# Patient Record
Sex: Male | Born: 1980 | Race: Black or African American | Hispanic: No | Marital: Single | Smoking: Current every day smoker
Health system: Southern US, Community
[De-identification: ages and names within clinical notes are randomized; demographics above are authoritative.]

## PROBLEM LIST (undated history)

## (undated) DIAGNOSIS — G43909 Migraine, unspecified, not intractable, without status migrainosus: Secondary | ICD-10-CM

## (undated) DIAGNOSIS — IMO0002 Reserved for concepts with insufficient information to code with codable children: Secondary | ICD-10-CM

## (undated) DIAGNOSIS — F99 Mental disorder, not otherwise specified: Secondary | ICD-10-CM

---
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
## 2003-03-27 ENCOUNTER — Emergency Department (HOSPITAL_COMMUNITY): Admission: EM | Admit: 2003-03-27 | Discharge: 2003-03-27 | Payer: Self-pay | Admitting: Emergency Medicine

## 2006-04-28 ENCOUNTER — Emergency Department (HOSPITAL_COMMUNITY): Admission: EM | Admit: 2006-04-28 | Discharge: 2006-04-28 | Payer: Self-pay | Admitting: Emergency Medicine

## 2007-12-20 ENCOUNTER — Emergency Department (HOSPITAL_COMMUNITY): Admission: EM | Admit: 2007-12-20 | Discharge: 2007-12-20 | Payer: Self-pay | Admitting: Emergency Medicine

## 2008-05-20 ENCOUNTER — Emergency Department (HOSPITAL_COMMUNITY): Admission: EM | Admit: 2008-05-20 | Discharge: 2008-05-21 | Payer: Self-pay | Admitting: Emergency Medicine

## 2009-11-06 ENCOUNTER — Emergency Department (HOSPITAL_COMMUNITY): Admission: EM | Admit: 2009-11-06 | Discharge: 2009-11-06 | Payer: Self-pay | Admitting: Emergency Medicine

## 2010-05-21 ENCOUNTER — Emergency Department (HOSPITAL_COMMUNITY): Payer: Self-pay

## 2010-05-21 ENCOUNTER — Inpatient Hospital Stay (HOSPITAL_COMMUNITY)
Admission: EM | Admit: 2010-05-21 | Discharge: 2010-05-23 | DRG: 378 | Disposition: A | Discharge status: Home / Self Care | Payer: Self-pay | Attending: Internal Medicine | Admitting: Internal Medicine

## 2010-05-21 DIAGNOSIS — F1910 Other psychoactive substance abuse, uncomplicated: Secondary | ICD-10-CM | POA: Diagnosis present

## 2010-05-21 DIAGNOSIS — Z23 Encounter for immunization: Secondary | ICD-10-CM

## 2010-05-21 DIAGNOSIS — K254 Chronic or unspecified gastric ulcer with hemorrhage: Principal | ICD-10-CM | POA: Diagnosis present

## 2010-05-21 DIAGNOSIS — G43909 Migraine, unspecified, not intractable, without status migrainosus: Secondary | ICD-10-CM | POA: Diagnosis present

## 2010-05-21 DIAGNOSIS — F17200 Nicotine dependence, unspecified, uncomplicated: Secondary | ICD-10-CM | POA: Diagnosis present

## 2010-05-21 DIAGNOSIS — T39095A Adverse effect of salicylates, initial encounter: Secondary | ICD-10-CM | POA: Diagnosis present

## 2010-05-21 DIAGNOSIS — D62 Acute posthemorrhagic anemia: Secondary | ICD-10-CM | POA: Diagnosis present

## 2010-05-21 LAB — OCCULT BLOOD, POC DEVICE: Fecal Occult Bld: POSITIVE

## 2010-05-21 LAB — COMPREHENSIVE METABOLIC PANEL
ALT: 12 U/L (ref 0–53)
Alkaline Phosphatase: 53 U/L (ref 39–117)
BUN: 39 mg/dL — ABNORMAL HIGH (ref 6–23)
CO2: 27 mEq/L (ref 19–32)
GFR calc non Af Amer: >60 mL/min (ref >60)
Glucose, Bld: 97 mg/dL (ref 70–99)
Potassium: 4.2 mEq/L (ref 3.5–5.1)
Sodium: 139 mEq/L (ref 135–145)
Total Bilirubin: 0.3 mg/dL (ref 0.3–1.2)

## 2010-05-21 LAB — CBC
HCT: 26.3 % — ABNORMAL LOW (ref 39.0–52.0)
Hemoglobin: 8.6 g/dL — ABNORMAL LOW (ref 13.0–17.0)
MCH: 29.2 pg (ref 26.0–34.0)
MCHC: 32.7 g/dL (ref 30.0–36.0)
MCV: 89.2 fL (ref 78.0–100.0)
Platelets: 276 10*3/uL (ref 150–400)
RBC: 2.95 MIL/uL — ABNORMAL LOW (ref 4.22–5.81)
RDW: 13.3 % (ref 11.5–15.5)
WBC: 11.1 10*3/uL — ABNORMAL HIGH (ref 4.0–10.5)

## 2010-05-22 ENCOUNTER — Other Ambulatory Visit: Payer: Self-pay | Admitting: Gastroenterology

## 2010-05-22 LAB — IRON AND TIBC
Saturation Ratios: 36 % (ref 20–55)
TIBC: 286 ug/dL (ref 215–435)

## 2010-05-22 LAB — CBC
HCT: 21.5 % — ABNORMAL LOW (ref 39.0–52.0)
HCT: 20.5 % — ABNORMAL LOW (ref 39.0–52.0)
HCT: 23.7 % — ABNORMAL LOW (ref 39.0–52.0)
Hemoglobin: 6.6 g/dL — CL (ref 13.0–17.0)
MCH: 28.4 pg (ref 26.0–34.0)
MCH: 29.6 pg (ref 26.0–34.0)
MCHC: 32.2 g/dL (ref 30.0–36.0)
MCHC: 33.3 g/dL (ref 30.0–36.0)
MCV: 88.8 fL (ref 78.0–100.0)
RDW: 13.3 % (ref 11.5–15.5)
RDW: 13.6 % (ref 11.5–15.5)
WBC: 7.1 10*3/uL (ref 4.0–10.5)

## 2010-05-22 LAB — FOLATE: Folate: 11.3 ng/mL

## 2010-05-22 LAB — ABO/RH: ABO/RH(D): O POS

## 2010-05-22 LAB — MRSA PCR SCREENING: MRSA by PCR: NEGATIVE

## 2010-05-22 LAB — RETICULOCYTES: Retic Ct Pct: 1.6 % (ref 0.4–3.1)

## 2010-05-23 LAB — CBC
MCH: 29.6 pg (ref 26.0–34.0)
MCHC: 33.6 g/dL (ref 30.0–36.0)
Platelets: 199 10*3/uL (ref 150–400)
Platelets: 205 10*3/uL (ref 150–400)
RDW: 13.8 % (ref 11.5–15.5)
WBC: 7.3 10*3/uL (ref 4.0–10.5)

## 2010-05-23 LAB — CROSSMATCH
ABO/RH(D): O POS
Antibody Screen: NEGATIVE
Unit division: 0

## 2010-05-23 NOTE — H&P (Signed)
[name], [name]                   ACCOUNT NO.:  [id number]  MEDICAL RECORD NO.:  [id number]           PATIENT TYPE:  E  LOCATION:  [facility]                         FACILITY:  [facility]  PHYSICIAN:  Conley Canal, MD      DATE OF BIRTH:  26-Mar-1980  DATE OF ADMISSION:  05/21/2010 DATE OF DISCHARGE:                             HISTORY & PHYSICAL   PRIMARY CARE PHYSICIAN:  None.  CHIEF COMPLAINT:  Melena, abdominal pain.  HISTORY OF PRESENT ILLNESS:  This is a 30 year old male with history of polysubstance abuse, tobacco habituation, migraine headaches who came into the emergency room with complaints of abdominal pain and melena, which started during the day.  The patient has been take taking a lot of Excedrin Headache for migraine headaches.  He has also been snorting cocaine.  He also complained of not feeling well.  At the time of evaluation, the patient is not very eager to give history, but the emergency room record, he vomited some blood clots and he has had melena.  His hemoglobin was 8.6, hematocrit 26.3, no baseline to compare with.  He was also hypotensive and tachycardic with blood pressure in the 70s systolic, heart rate around 117.  He has been given some crystalloids with appropriate response in the blood pressure. Gastroenterology, Dr. Bosie Clos was consulted by the emergency room.  The patient denies history of peptic ulcer disease.  PAST MEDICAL HISTORY: 1. Migraine headaches. 2. Polysubstance abuse.  HOME MEDICATIONS:  Excedrin Headache.  SOCIAL HISTORY:  The patient smokes cigarettes.  Denies alcohol.  Snorts cocaine.  FAMILY HISTORY:  Denies history of chronic medical conditions.  ALLERGIES:  No known drug allergies.  REVIEW OF SYSTEMS:  Unremarkable except as highlighted in the history of present illness.  PHYSICAL EXAMINATION:  GENERAL:  This is a young male who seems to be in discomfort, is mostly somnolent. VITALS:  Blood pressure 116/72, heart rate 90s,  he is febrile, oxygenating adequately, respiratory rate 16. HEAD, EARS, NOSE, AND THROAT:  Pupils equal and reacting to light. NECK:  No jugular venous distention.  No carotid bruits. RESPIRATORY:  Good air entry bilaterally with no rhonchi, rales, or wheezes. CARDIOVASCULAR:  First and second heart sounds heard.  No murmurs. Pulse regular. ABDOMEN:  Scaphoid, soft, nontender.  No palpable organomegaly.  Bowel sounds are normal. CNS:  The patient alert, responding to questions appropriately, no focal deficits. EXTREMITIES:  No pedal edema.  Peripheral pulses equal.  LABORATORY DATA:  Labs reviewed significant for WBC 7.1, hemoglobin 8.6, hematocrit 26.3, platelet count 276.  Sodium 139, potassium 4.2, BUN 39, creatinine 1.13.  Urine drug screen positive for cocaine, opiates, marijuana.  Abdominal x-ray showed nonobstructive bowel gas pattern.  No active cardiopulmonary disease.  IMPRESSION:  A 30 year old male presenting with acute GI bleeding most likely bleeding peptic ulcer disease.  He is also anemic, baseline hemoglobin not clear.  His BUN elevated probably suggest GI blood loss.  PLAN: 1. Upper GI bleeding with acute blood loss anemia.  We will admit the     patient to step-down unit, monitor hemoglobin/hematocrit serially,  transfuse PRBC for target hematocrit less than at least 25.  We     will place the patient on PPI.  Avoid nonsteroidal anti-     inflammatory drugs.  Follow up with gastroenterologist.  The     patient will most likely require EGD.  We will keep the patient     n.p.o. 2. Polysubstance abuse.  The patient currently drowsy wanting to     follow this up, the patient want to be more awake. 3. Tobacco habituation.  Smoking cessation counseling given, placed on     nicotine patch. 4. DVT prophylaxis.  SCDs. 5. The patient's condition is closely guarded.     Conley Canal, MD     SR/MEDQ  D:  05/22/2010  T:  05/22/2010  Job:   [id number]  Electronically Signed by Conley Canal  on 05/23/2010 05:23:56 PM

## 2010-05-24 NOTE — Discharge Summary (Signed)
NAMECISCO, KINDT                   ACCOUNT NO.:  [id number]  MEDICAL RECORD NO.:  [id number]           PATIENT TYPE:  I  LOCATION:  5511                         FACILITY:  [facility]  PHYSICIAN:  Marinda Elk, M.D.DATE OF BIRTH:  Jul 05, 1980  DATE OF ADMISSION:  05/21/2010 DATE OF DISCHARGE:  05/23/2010                              DISCHARGE SUMMARY   PRIMARY CARE DOCTOR:  None.  DISCHARGE DIAGNOSES: 1. Acute loss anemia secondary to peptic ulcer disease, status post     EGD. Biopsy pending for H. Pylori. 2. Tobacco abuse. 3. Polysubstance abuse.  DISCHARGE MEDICATIONS: 1. Tylenol 650 mg q.4 h. p.r.n. 2. Nicotine patch 14 mg q.24 h transdermally daily. 3. Protonix 40 mg 1 tablet b.i.d. 4. Sumatriptan 50 mg he as directed.  PROCEDURES PERFORMED:  EGD that showed antral ulcer with significant stigma of previous hemorrhage.  Acute abdominal series showed no obstructive bowel gas pattern.  No cardiopulmonary disease.  BRIEF ADMITTING H AND P:  This 30 year old male with past medical history of polysubstance abuse, tobacco, and migraine headaches, who presents to the emergency room complaining of abdominal pain and melena. Please refer to dictation.  This melena which started during the day. The patient has been taking a lot of Excedrin for the migraine headaches.  He is also being snorting lot of cocaine.  He has now been feeling well.  Please refer to dictation May 06, 2010, for further details.  PHYSICAL EXAM:  VITAL SIGNS:  Blood pressure 116/72, pulse of 90, afebrile, oxygenating adequately, breathing 16 times per minutes. HEENT: Pupils equally round, reactive to light. NECK:  No JVD, no bruits. LUNGS:  Good air movement.  Clear to auscultation. CARDIOVASCULAR:  Positive S1-S2.  No murmurs, rubs or gallops.  Regular rate and rhythm. ABDOMEN:  Positive bowel sounds, soft, nontender, organomegaly.  Bowel sounds present. CNS: Alert, not responding to question,  "appropriately nonfocal."  LABS ON ADMISSION:  White count of 7.1, hemoglobin of 8.6, platelet count 276, sodium 31, potassium 4.2, BUN of 39, creatinine 1.9.  UDS is positive for opiates, cocaine and marijuana.  Abdominal x-ray as above.  ASSESSMENT/PLAN: 1. Acute blood loss anemia secondary to peptic ulcer disease, status     post EGD and 2 units transfused.  Dr. Madilyn Fireman did the endoscopy, he     put him on high-dose PPI.  H. pylori biopsy is pending at the time     of this dictation.  He needs to follow up with GI for results of     the H. pylori biopsy.  It was discussed extensively with the     patient that he needs to avoid nonsteroidal antiinflammatory drugs     at all cost.  As this will cause him to these ulcers.  He also     needs to avoid cocaine as this may be contributing and tobacco too.     His hemoglobin remained stable.  He was transfused 2 units during     this hospital stay, cross-linked hemoglobin stable.  His hemoglobin     initially dropped, he was transfused 2 units, but  has remained     stable.  He has complained no further melena. 2. Tobacco counseling.  He was put on a nicotine patch.  It was     discussed with him that he need to try to stop using tobacco, as     this may be contributing to other polysubstance abuse.  He is put     on sumatriptan for her headaches which has controlled his headache.     He was told that he needs to avoid cocaine as this - cocaine and     sumatriptan can cause him to have a heart attack or stroke.  He     will understand as he will not do any further substance.  He was     discharged in stable condition. 3. Migraine headaches.  He was started on sumatriptan.  Vitals on day of discharge temperature 98, pulse 71, respiration 19, blood pressure 132/74, he was satting 96% on room air.  Labs on day of discharge shows a white count 7.8, hemoglobin of 8.3, platelet count of 199.     Marinda Elk, M.D.     AF/MEDQ  D:   05/23/2010  T:  05/23/2010  Job:  [id number]  cc:   Everardo All. Madilyn Fireman, M.D.  Electronically Signed by Lambert Keto M.D. on 05/24/2010 02:37:26 PM

## 2010-05-31 NOTE — Op Note (Signed)
  NAMEDEVINN, Dunn                   ACCOUNT NO.:  [id number]  MEDICAL RECORD NO.:  [id number]           PATIENT TYPE:  I  LOCATION:  5511                         FACILITY:  [facility]  PHYSICIAN:  Pretty Weltman C. Madilyn Fireman, M.D.    DATE OF BIRTH:  10-17-1980  DATE OF PROCEDURE: DATE OF DISCHARGE:                              OPERATIVE REPORT   PROCEDURE:  Esophagogastroduodenoscopy with biopsy.  INDICATION FOR PROCEDURE:  Prepyloric ulcer.  PROCEDURE:  The patient was placed in the left lateral decubitus position and placed on the pulse monitor with continuous low-flow oxygen delivered by nasal cannula.  He was sedated with 50 mcg IV fentanyl and 5 mg IV Versed.  Olympus video endoscope was advanced under direct vision into the oropharynx and esophagus.  The esophagus was straight of normal caliber with the squamocolumnar line of 38 cm.  There is no visible hiatal hernia ring stricture or other abnormality of the GE junction.  Stomach was entered and small amount of liquid secretions were suctioned from the fundus.  Retroflexed view of cardia was unremarkable.  The fundus and body appeared normal.  The antrum showed a clean-based 8 x 4 mm ulcer with a reddish spot on one end, but no definite visible vessel of which may have represented a visible vessel, but no active bleeding.  No blood seen in the fundus or cardia and the stomach or duodenum.  The pylorus was not deformed and easily allowed passage of the endoscope tip into the duodenum.  Both bulb and second portion were inspected and appeared within normal limits.  The scope was withdrawn back into the stomach and biopsies obtained adjacent, would also rule out H pylori.  The scope was then withdrawn and the patient returned to the recovery room in stable condition.  He tolerated the procedure well and there were no immediate complications.  IMPRESSION:  Antral ulcer with slight stigma of previous hemorrhage.  No active bleeding  currently.  PLAN:  Treat with double-dose proton pump inhibitor, await biopsies and then treat for Helicobacter if present and avoid nonsteroidal antiinflammatory drugs.          ______________________________ Everardo All Madilyn Fireman, M.D.     JCH/MEDQ  D:  05/22/2010  T:  05/23/2010  Job:  [id number]  Electronically Signed by Dorena Cookey M.D. on 05/29/2010 07:11:04 PM

## 2010-05-31 NOTE — Consult Note (Signed)
  [name], [name]                   ACCOUNT NO.:  [id number]  MEDICAL RECORD NO.:  [id number]           PATIENT TYPE:  I  LOCATION:  2503                         FACILITY:  [facility]  PHYSICIAN:  Maury Bamba C. Madilyn Fireman, M.D.    DATE OF BIRTH:  March 08, 1981  DATE OF CONSULTATION:  05/22/2010 DATE OF DISCHARGE:                                CONSULTATION   REASON FOR CONSULT:  GI bleeding.  HISTORY OF PRESENT ILLNESS:  The patient is a 30 year old black male, who reportedly been taking lot of Excedrin for migraine headaches as well as snorting cocaine.  He complains of malaise with vomiting of coffee-ground material and some melena, although he is not a very good historian.  His hemoglobin was 8.6 with initial systolic blood pressure in the 70s.  His BUN was elevated at 39 with a creatinine of 1.13. Repeat hemoglobin this morning was 7.1.  The patient is not a very good historian, does not want to give lot of detail, as noted during in the admission history and physical.  PAST MEDICAL HISTORY: 1. Polysubstance abuse. 2. Migraine headaches.  MEDICATIONS:  Excedrin, headache.  SOCIAL HISTORY:  The patient smokes cigarettes.  Denies alcohol use.  He does snort cocaine.  FAMILY HISTORY:  Noncontributory.  ALLERGIES:  None known.  PHYSICAL EXAMINATION:  GENERAL:  Somnolent, black male, in no acute distress. HEART:  Regular rate and rhythm without murmurs. LUNGS:  Clear. ABDOMEN:  Soft, nondistended with normoactive bowel sounds.  No hepatosplenomegaly, masses, or guarding.  IMPRESSION:  Likely gastrointestinal bleed related to NSAID use with the upper source strongly suggested.  PLAN:  We will proceed with EGD.  He may need transfusion and continue proton pump inhibitor.         ______________________________ Everardo All. Madilyn Fireman, M.D.    JCH/MEDQ  D:  05/22/2010  T:  05/22/2010  Job:  [id number]  Electronically Signed by Dorena Cookey M.D. on 05/29/2010 07:10:59 PM

## 2010-06-26 ENCOUNTER — Emergency Department (HOSPITAL_COMMUNITY)
Admission: EM | Admit: 2010-06-26 | Discharge: 2010-06-26 | Disposition: A | Discharge status: Home / Self Care | Payer: Self-pay | Attending: Emergency Medicine | Admitting: Emergency Medicine

## 2010-06-26 DIAGNOSIS — R110 Nausea: Secondary | ICD-10-CM | POA: Insufficient documentation

## 2010-06-26 DIAGNOSIS — H53149 Visual discomfort, unspecified: Secondary | ICD-10-CM | POA: Insufficient documentation

## 2010-06-26 DIAGNOSIS — R51 Headache: Secondary | ICD-10-CM | POA: Insufficient documentation

## 2011-11-29 ENCOUNTER — Encounter (HOSPITAL_BASED_OUTPATIENT_CLINIC_OR_DEPARTMENT_OTHER): Payer: Self-pay | Admitting: *Deleted

## 2011-11-29 ENCOUNTER — Emergency Department (HOSPITAL_BASED_OUTPATIENT_CLINIC_OR_DEPARTMENT_OTHER)
Admission: EM | Admit: 2011-11-29 | Discharge: 2011-11-29 | Disposition: A | Discharge status: Home / Self Care | Payer: Self-pay | Attending: Emergency Medicine | Admitting: Emergency Medicine

## 2011-11-29 DIAGNOSIS — G43909 Migraine, unspecified, not intractable, without status migrainosus: Secondary | ICD-10-CM | POA: Insufficient documentation

## 2011-11-29 DIAGNOSIS — F17200 Nicotine dependence, unspecified, uncomplicated: Secondary | ICD-10-CM | POA: Insufficient documentation

## 2011-11-29 HISTORY — DX: Migraine, unspecified, not intractable, without status migrainosus: G43.909

## 2011-11-29 MED ORDER — DIPHENHYDRAMINE HCL 50 MG/ML IJ SOLN
25.0000 mg | Freq: Once | INTRAMUSCULAR | Status: AC
  Administered 2011-11-29: 50 mg via INTRAVENOUS
  Filled 2011-11-29: qty 1

## 2011-11-29 MED ORDER — KETOROLAC TROMETHAMINE 30 MG/ML IJ SOLN
30.0000 mg | Freq: Once | INTRAMUSCULAR | Status: AC
  Administered 2011-11-29: 30 mg via INTRAVENOUS
  Filled 2011-11-29: qty 1

## 2011-11-29 MED ORDER — SODIUM CHLORIDE 0.9 % IV BOLUS (SEPSIS)
1000.0000 mL | Freq: Once | INTRAVENOUS | Status: AC
  Administered 2011-11-29: 1000 mL via INTRAVENOUS

## 2011-11-29 MED ORDER — PROMETHAZINE HCL 25 MG/ML IJ SOLN
25.0000 mg | Freq: Once | INTRAMUSCULAR | Status: AC
  Administered 2011-11-29: 25 mg via INTRAVENOUS
  Filled 2011-11-29: qty 1

## 2011-11-29 MED ORDER — DROPERIDOL 2.5 MG/ML IJ SOLN: 1.2500 mg | Freq: Once | INTRAMUSCULAR | Status: DC

## 2011-11-29 MED ORDER — DEXAMETHASONE SODIUM PHOSPHATE 10 MG/ML IJ SOLN
10.0000 mg | Freq: Once | INTRAMUSCULAR | Status: AC
  Administered 2011-11-29: 10 mg via INTRAVENOUS
  Filled 2011-11-29: qty 1

## 2011-11-29 NOTE — ED Provider Notes (Signed)
History     CSN: [id number]  Arrival date & time 11/29/11  0223   None     Chief Complaint  Patient presents with  . Migraine    (Consider location/radiation/quality/duration/timing/severity/associated sxs/prior treatment) HPI  Migraine headache patient states awoke from sleep at facility where he is staying.  Pain like usual migraine.  Right side of head, photophobia, nausea, severe pain like prior headaches.  No fever, visual changes, focal neuro deficits.  Took ibuprofen without relief.   Past Medical History  Diagnosis Date  . Migraine     History reviewed. No pertinent past surgical history.  History reviewed. No pertinent family history.  History  Substance Use Topics  . Smoking status: Current Everyday Smoker  . Smokeless tobacco: Not on file  . Alcohol Use: No      Review of Systems  Constitutional: Negative for fever and chills.  HENT: Negative for neck stiffness.   Eyes: Positive for photophobia. Negative for visual disturbance.  Respiratory: Negative for shortness of breath.   Cardiovascular: Negative for chest pain.  Gastrointestinal: Negative for vomiting, diarrhea and blood in stool.  Genitourinary: Negative for dysuria, frequency and decreased urine volume.  Musculoskeletal: Negative for myalgias and joint swelling.  Skin: Negative for rash.  Neurological: Negative for weakness.  Hematological: Negative for adenopathy.  Psychiatric/Behavioral: Negative for agitation.    Allergies  Review of patient's allergies indicates no known allergies.  Home Medications  No current outpatient prescriptions on file.  BP 150/113  Pulse 63  Temp 97.9 F (36.6 C) (Oral)  Resp 20  Ht 5\' 8"  (1.727 m)  Wt 148 lb (67.132 kg)  BMI 22.50 kg/m2  SpO2 100%  Physical Exam  Nursing note and vitals reviewed. Constitutional: He is oriented to person, place, and time. He appears well-developed and well-nourished.  HENT:  Head: Normocephalic and atraumatic.    Right Ear: External ear normal.  Left Ear: External ear normal.  Nose: Nose normal.  Mouth/Throat: Oropharynx is clear and moist.  Eyes: Conjunctivae and EOM are normal. Pupils are equal, round, and reactive to light.  Neck: Normal range of motion. Neck supple.  Cardiovascular: Normal rate, regular rhythm, normal heart sounds and intact distal pulses.   Pulmonary/Chest: Effort normal and breath sounds normal. No respiratory distress. He has no wheezes. He exhibits no tenderness.  Abdominal: Soft. Bowel sounds are normal. He exhibits no distension and no mass. There is no tenderness. There is no guarding.  Musculoskeletal: Normal range of motion.  Neurological: He is alert and oriented to person, place, and time. He has normal reflexes. He displays normal reflexes. He exhibits normal muscle tone. Coordination normal.  Skin: Skin is warm and dry.  Psychiatric: He has a normal mood and affect. His behavior is normal. Judgment and thought content normal.    ED Course  Procedures (including critical care time)  Labs Reviewed - No data to display No results found.   No diagnosis found.    MDM         Hilario Quarry, MD 12/02/11 [phone]

## 2011-11-29 NOTE — ED Notes (Signed)
Pt c/o migraine headache that woke from sleep. Pt took ibuprofen PTA without relief. Pt is currently a resident at [facility].

## 2012-09-16 ENCOUNTER — Emergency Department (HOSPITAL_COMMUNITY)
Admission: EM | Admit: 2012-09-16 | Discharge: 2012-09-16 | Discharge status: Against Medical Advice | Payer: Self-pay | Attending: Emergency Medicine | Admitting: Emergency Medicine

## 2012-09-16 ENCOUNTER — Encounter (HOSPITAL_COMMUNITY): Payer: Self-pay | Admitting: *Deleted

## 2012-09-16 DIAGNOSIS — R51 Headache: Secondary | ICD-10-CM | POA: Insufficient documentation

## 2012-09-16 DIAGNOSIS — F17200 Nicotine dependence, unspecified, uncomplicated: Secondary | ICD-10-CM | POA: Insufficient documentation

## 2012-09-16 NOTE — ED Notes (Signed)
Pt did not answer x 3 

## 2012-09-16 NOTE — ED Notes (Signed)
Unable to locate patient x3.

## 2012-09-16 NOTE — ED Notes (Signed)
Pt in c/o migraine since last night, history of same, states it woke him from sleep and is making him tearful, sensitive to light.

## 2012-11-30 ENCOUNTER — Encounter (HOSPITAL_COMMUNITY): Payer: Self-pay | Admitting: Emergency Medicine

## 2012-11-30 ENCOUNTER — Emergency Department (HOSPITAL_COMMUNITY)
Admission: EM | Admit: 2012-11-30 | Discharge: 2012-11-30 | Discharge status: Against Medical Advice | Payer: Self-pay | Attending: Emergency Medicine | Admitting: Emergency Medicine

## 2012-11-30 DIAGNOSIS — R51 Headache: Secondary | ICD-10-CM | POA: Insufficient documentation

## 2012-11-30 NOTE — ED Notes (Signed)
Pt c/o migraine HA with pain on right side x 30 min

## 2012-11-30 NOTE — ED Notes (Signed)
Unable to locate patient x3 when called for room

## 2012-12-14 ENCOUNTER — Emergency Department (INDEPENDENT_AMBULATORY_CARE_PROVIDER_SITE_OTHER)
Admission: EM | Admit: 2012-12-14 | Discharge: 2012-12-14 | Disposition: A | Discharge status: Home / Self Care | Payer: Self-pay | Source: Home / Self Care | Attending: Family Medicine | Admitting: Family Medicine

## 2012-12-14 ENCOUNTER — Encounter (HOSPITAL_COMMUNITY): Payer: Self-pay | Admitting: Emergency Medicine

## 2012-12-14 DIAGNOSIS — S025XXS Fracture of tooth (traumatic), sequela: Secondary | ICD-10-CM

## 2012-12-14 DIAGNOSIS — S0291XS Unspecified fracture of skull, sequela: Secondary | ICD-10-CM

## 2012-12-14 MED ORDER — PENICILLIN V POTASSIUM 500 MG PO TABS: 500.0000 mg | ORAL_TABLET | Freq: Four times a day (QID) | ORAL | Status: AC

## 2012-12-14 MED ORDER — HYDROCODONE-ACETAMINOPHEN 5-325 MG PO TABS: 1.0000 | ORAL_TABLET | Freq: Four times a day (QID) | ORAL | Status: DC | PRN

## 2012-12-14 NOTE — ED Notes (Signed)
C/o dental pain. Left lower tooth has a hole in it. Onset x 2 days ago along with migraine. Hx of recurrent migraines.  Denies n/v. Pt has been taking ibuprofen with no relief in symptoms.

## 2012-12-14 NOTE — ED Provider Notes (Signed)
CSN: [id number]     Arrival date & time 12/14/12  1807 History   First MD Initiated Contact with Patient 12/14/12 1928     Chief Complaint  Patient presents with  . Dental Pain    x couple of days along with migraine. otc meds not working.   . Migraine   (Consider location/radiation/quality/duration/timing/severity/associated sxs/prior Treatment) HPI Comments: Pt noticed a "hole" in his tooth a week ago. Now is very painful  Patient is a 32 y.o. male presenting with tooth pain. The history is provided by the patient.  Dental Pain Location:  Lower Lower teeth location:  19/LL 1st molar Quality:  Aching Severity:  Severe Onset quality:  Gradual Duration:  1 week Timing:  Constant Progression:  Worsening Chronicity:  New Context: dental fracture   Relieved by:  Nothing Worsened by:  Cold food/drink Ineffective treatments:  NSAIDs Associated symptoms: no facial pain, no facial swelling, no fever and no gum swelling     Past Medical History  Diagnosis Date  . Migraine    History reviewed. No pertinent past surgical history. History reviewed. No pertinent family history. History  Substance Use Topics  . Smoking status: Current Every Day Smoker  . Smokeless tobacco: Not on file  . Alcohol Use: No    Review of Systems  Constitutional: Negative for fever and chills.  HENT: Positive for dental problem. Negative for facial swelling.     Allergies  Review of patient's allergies indicates no known allergies.  Home Medications   Current Outpatient Rx  Name  Route  Sig  Dispense  Refill  . acetaminophen (TYLENOL) 500 MG tablet   Oral   Take 500 mg by mouth every 6 (six) hours as needed for pain (pain).         Marland Kitchen HYDROcodone-acetaminophen (NORCO/VICODIN) 5-325 MG per tablet   Oral   Take 1 tablet by mouth every 6 (six) hours as needed for pain.   10 tablet   0   . penicillin v potassium (VEETID) 500 MG tablet   Oral   Take 1 tablet (500 mg total) by mouth 4 (four)  times daily.   40 tablet   0    BP 119/71  Pulse 75  Temp(Src) 97.6 F (36.4 C) (Oral)  Resp 16  SpO2 100% Physical Exam  Constitutional: He appears well-developed and well-nourished.  Appears in pain  HENT:  Mouth/Throat: Dental caries present. No dental abscesses.      ED Course  Procedures (including critical care time) Labs Review Labs Reviewed - No data to display Imaging Review No results found.  MDM   1. Tooth fracture, sequela   rx pcn 500mg  QID for 10 days, rx hydrocodone 5/325 q6 hours prn pain #10. Pt referred to upcoming free dental clinic.      Cathlyn Parsons, NP 12/14/12 1940

## 2012-12-15 ENCOUNTER — Emergency Department (HOSPITAL_COMMUNITY)
Admission: EM | Admit: 2012-12-15 | Discharge: 2012-12-15 | Disposition: A | Discharge status: Home / Self Care | Payer: Self-pay | Attending: Emergency Medicine | Admitting: Emergency Medicine

## 2012-12-15 ENCOUNTER — Encounter (HOSPITAL_COMMUNITY): Payer: Self-pay | Admitting: Emergency Medicine

## 2012-12-15 DIAGNOSIS — R51 Headache: Secondary | ICD-10-CM | POA: Insufficient documentation

## 2012-12-15 DIAGNOSIS — K0889 Other specified disorders of teeth and supporting structures: Secondary | ICD-10-CM

## 2012-12-15 DIAGNOSIS — Z79899 Other long term (current) drug therapy: Secondary | ICD-10-CM | POA: Insufficient documentation

## 2012-12-15 DIAGNOSIS — K1370 Unspecified lesions of oral mucosa: Secondary | ICD-10-CM | POA: Insufficient documentation

## 2012-12-15 DIAGNOSIS — K0381 Cracked tooth: Secondary | ICD-10-CM | POA: Insufficient documentation

## 2012-12-15 DIAGNOSIS — Z8679 Personal history of other diseases of the circulatory system: Secondary | ICD-10-CM | POA: Insufficient documentation

## 2012-12-15 DIAGNOSIS — K089 Disorder of teeth and supporting structures, unspecified: Secondary | ICD-10-CM | POA: Insufficient documentation

## 2012-12-15 MED ORDER — OXYCODONE-ACETAMINOPHEN 5-325 MG PO TABS: 1.0000 | ORAL_TABLET | ORAL | Status: DC | PRN

## 2012-12-15 MED ORDER — AMOXICILLIN 500 MG PO CAPS: 500.0000 mg | ORAL_CAPSULE | Freq: Three times a day (TID) | ORAL | Status: DC

## 2012-12-15 NOTE — ED Provider Notes (Signed)
CSN: [id number]     Arrival date & time 12/15/12  1545 History  This chart was scribed for non-physician practitioner Arthor Captain, PA-C, working with Celene Kras, MD by Ronal Fear, ED scribe. This patient was seen in room WTR6/WTR6 and the patient's care was started at 5:40 PM.     Chief Complaint  Patient presents with  . Dental Pain    l/lower jaw pain   Patient is a 32 y.o. male presenting with tooth pain. The history is provided by the patient. No language interpreter was used.  Dental Pain Location:  Lower Lower teeth location:  19/LL 1st molar Quality:  Constant Severity:  Mild Onset quality:  Sudden Duration:  8 hours Timing:  Constant Progression:  Worsening Chronicity:  New Context: dental fracture   Relieved by:  None tried Worsened by:  Cold food/drink Ineffective treatments:  None tried Associated symptoms: facial pain and gum swelling   Associated symptoms: no difficulty swallowing, no fever and no headaches     Past Medical History  Diagnosis Date  . Migraine    No past surgical history on file. History reviewed. No pertinent family history. History  Substance Use Topics  . Smoking status: Never Smoker   . Smokeless tobacco: Not on file  . Alcohol Use: No    Review of Systems  Constitutional: Negative for fever and chills.  HENT: Positive for dental problem. Negative for sore throat and trouble swallowing.   Respiratory: Negative for shortness of breath and stridor.   Gastrointestinal: Negative for nausea and vomiting.  Musculoskeletal: Negative for myalgias.  Neurological: Negative for headaches.  All other systems reviewed and are negative.    Allergies  Review of patient's allergies indicates no known allergies.  Home Medications   Current Outpatient Rx  Name  Route  Sig  Dispense  Refill  . acetaminophen (TYLENOL) 500 MG tablet   Oral   Take 500 mg by mouth every 6 (six) hours as needed for pain (pain).         Marland Kitchen  HYDROcodone-acetaminophen (NORCO/VICODIN) 5-325 MG per tablet   Oral   Take 1 tablet by mouth every 6 (six) hours as needed for pain.   10 tablet   0   . penicillin v potassium (VEETID) 500 MG tablet   Oral   Take 1 tablet (500 mg total) by mouth 4 (four) times daily.   40 tablet   0    BP 120/74  Pulse 74  Temp(Src) 98.3 F (36.8 C) (Oral)  Resp 16  Wt 145 lb (65.772 kg)  BMI 22.05 kg/m2  SpO2 99% Physical Exam  Nursing note and vitals reviewed. Constitutional: He is oriented to person, place, and time. He appears well-developed and well-nourished. No distress.  HENT:  Head: Normocephalic and atraumatic.  Eyes: EOM are normal.  Neck: Neck supple. No tracheal deviation present.  Cardiovascular: Normal rate.   Pulmonary/Chest: Effort normal. No respiratory distress.  Musculoskeletal: Normal range of motion.  Neurological: He is alert and oriented to person, place, and time.  Skin: Skin is warm and dry.  Psychiatric: He has a normal mood and affect. His behavior is normal.    ED Course  Procedures (including critical car  DIAGNOSTIC STUDIES: Oxygen Saturation is 99% on RA, normal by my interpretation.    COORDINATION OF CARE: 5:50 PM- Pt advised of plan for treatment including dental nerve block, prescription for pain medication and dental referral and pt agrees.   Dental Performed by: Arthor Captain  Authorized by: Arthor Captain Consent: Verbal consent obtained. Patient understanding: patient states understanding of the procedure being performed Patient identity confirmed: verbally with patient Local anesthesia used: yes Local anesthetic: bupivacaine 0.5% with epinephrine Anesthetic total: 0.4 ml Patient sedated: no Patient tolerance: Patient tolerated the procedure well with no immediate complications.      Labs Review Labs Reviewed - No data to display Imaging Review No results found.  MDM   1. Pain, dental    Patient with toothache.  No gross  abscess.  Exam unconcerning for Ludwig's angina or spread of infection.  Will treat with penicillin and pain medicine.  Urged patient to follow-up with dentist.     I personally performed the services described in this documentation, which was scribed in my presence. The recorded information has been reviewed and is accurate.     Arthor Captain, PA-C 12/17/12 2023

## 2012-12-15 NOTE — ED Notes (Signed)
Pt reports acute dental pain starting this am. Pain in l/lower mouth

## 2012-12-16 NOTE — ED Provider Notes (Signed)
Medical screening examination/treatment/procedure(s) were performed by resident physician or non-physician practitioner and as supervising physician I was immediately available for consultation/collaboration.   KINDL,JAMES DOUGLAS MD.   James D Kindl, MD 12/16/12 1522 

## 2012-12-18 NOTE — ED Provider Notes (Signed)
Medical screening examination/treatment/procedure(s) were performed by non-physician practitioner and as supervising physician I was immediately available for consultation/collaboration.    Wynonia Medero R Ernestine Langworthy, MD 12/18/12 0338 

## 2013-02-03 ENCOUNTER — Emergency Department (HOSPITAL_COMMUNITY)
Admission: EM | Admit: 2013-02-03 | Discharge: 2013-02-03 | Disposition: A | Discharge status: Home / Self Care | Payer: Self-pay | Attending: Emergency Medicine | Admitting: Emergency Medicine

## 2013-02-03 ENCOUNTER — Encounter (HOSPITAL_COMMUNITY): Payer: Self-pay | Admitting: Emergency Medicine

## 2013-02-03 DIAGNOSIS — H538 Other visual disturbances: Secondary | ICD-10-CM | POA: Insufficient documentation

## 2013-02-03 DIAGNOSIS — Z79899 Other long term (current) drug therapy: Secondary | ICD-10-CM | POA: Insufficient documentation

## 2013-02-03 DIAGNOSIS — R10817 Generalized abdominal tenderness: Secondary | ICD-10-CM | POA: Insufficient documentation

## 2013-02-03 DIAGNOSIS — K2960 Other gastritis without bleeding: Secondary | ICD-10-CM | POA: Insufficient documentation

## 2013-02-03 DIAGNOSIS — H53149 Visual discomfort, unspecified: Secondary | ICD-10-CM | POA: Insufficient documentation

## 2013-02-03 DIAGNOSIS — R51 Headache: Secondary | ICD-10-CM | POA: Insufficient documentation

## 2013-02-03 DIAGNOSIS — Z8669 Personal history of other diseases of the nervous system and sense organs: Secondary | ICD-10-CM | POA: Insufficient documentation

## 2013-02-03 DIAGNOSIS — Z8719 Personal history of other diseases of the digestive system: Secondary | ICD-10-CM | POA: Insufficient documentation

## 2013-02-03 LAB — COMPREHENSIVE METABOLIC PANEL
ALT: 15 U/L (ref 0–53)
AST: 19 U/L (ref 0–37)
Albumin: 3.8 g/dL (ref 3.5–5.2)
CO2: 26 mEq/L (ref 19–32)
Calcium: 9.3 mg/dL (ref 8.4–10.5)
Creatinine, Ser: 1.01 mg/dL (ref 0.50–1.35)
GFR calc non Af Amer: >90 mL/min (ref >90)
Potassium: 3.9 mEq/L (ref 3.5–5.1)
Sodium: 139 mEq/L (ref 135–145)

## 2013-02-03 LAB — URINALYSIS, ROUTINE W REFLEX MICROSCOPIC
Glucose, UA: NEGATIVE mg/dL
Hgb urine dipstick: NEGATIVE
Leukocytes, UA: NEGATIVE
Nitrite: NEGATIVE
Protein, ur: NEGATIVE mg/dL
Specific Gravity, Urine: 1.008 (ref 1.005–1.030)
pH: 7 (ref 5.0–8.0)

## 2013-02-03 LAB — CBC WITH DIFFERENTIAL/PLATELET
Basophils Absolute: 0 10*3/uL (ref 0.0–0.1)
Eosinophils Relative: 2 % (ref 0–5)
Hemoglobin: 12.4 g/dL — ABNORMAL LOW (ref 13.0–17.0)
Lymphocytes Relative: 38 % (ref 12–46)
Lymphs Abs: 2 10*3/uL (ref 0.7–4.0)
MCV: 86.5 fL (ref 78.0–100.0)
Monocytes Absolute: 0.5 10*3/uL (ref 0.1–1.0)
Neutro Abs: 2.7 10*3/uL (ref 1.7–7.7)
Neutrophils Relative %: 51 % (ref 43–77)
Platelets: 256 10*3/uL (ref 150–400)
RBC: 4.37 MIL/uL (ref 4.22–5.81)
RDW: 13.2 % (ref 11.5–15.5)
WBC: 5.3 10*3/uL (ref 4.0–10.5)

## 2013-02-03 MED ORDER — OMEPRAZOLE 20 MG PO CPDR: 20.0000 mg | DELAYED_RELEASE_CAPSULE | Freq: Two times a day (BID) | ORAL | Status: DC

## 2013-02-03 MED ORDER — SODIUM CHLORIDE 0.9 % IV BOLUS (SEPSIS)
1000.0000 mL | Freq: Once | INTRAVENOUS | Status: AC
  Administered 2013-02-03: 1000 mL via INTRAVENOUS

## 2013-02-03 MED ORDER — METOCLOPRAMIDE HCL 5 MG/ML IJ SOLN
10.0000 mg | Freq: Once | INTRAMUSCULAR | Status: AC
  Administered 2013-02-03: 10 mg via INTRAVENOUS
  Filled 2013-02-03: qty 2

## 2013-02-03 MED ORDER — DIPHENHYDRAMINE HCL 50 MG/ML IJ SOLN
25.0000 mg | Freq: Once | INTRAMUSCULAR | Status: AC
  Administered 2013-02-03: 25 mg via INTRAVENOUS
  Filled 2013-02-03: qty 1

## 2013-02-03 MED ORDER — DEXAMETHASONE SODIUM PHOSPHATE 10 MG/ML IJ SOLN
10.0000 mg | Freq: Once | INTRAMUSCULAR | Status: AC
  Administered 2013-02-03: 10 mg via INTRAVENOUS
  Filled 2013-02-03: qty 1

## 2013-02-03 MED ORDER — PANTOPRAZOLE SODIUM 40 MG PO TBEC
40.0000 mg | DELAYED_RELEASE_TABLET | Freq: Once | ORAL | Status: AC
  Administered 2013-02-03: 40 mg via ORAL
  Filled 2013-02-03: qty 1

## 2013-02-03 MED ORDER — RANITIDINE HCL 150 MG PO CAPS: 150.0000 mg | ORAL_CAPSULE | Freq: Every day | ORAL | Status: DC

## 2013-02-03 NOTE — ED Provider Notes (Signed)
CSN: [id number]     Arrival date & time 02/03/13  4098 History   First MD Initiated Contact with Patient 02/03/13 [phone]     Chief Complaint  Patient presents with  . Headache  . Abdominal Pain   (Consider location/radiation/quality/duration/timing/severity/associated sxs/prior Treatment) HPI Comments: Patient is a 32 year old male with history of migraines who presents today with worsening headache since yesterday as well as abdominal pain. His headache began gradually yesterday. His headache is right-sided. It is sharp and shooting through his head. This is the same as his normal migraines to improve his headache he took Goody's powder. The Goody's powder improved his headache, but today he has not taken any and his headache is worse. He has a history of ulcers in his stomach. This was many years ago and he does not remember what treatment they gave him. He is concerned now because this morning he developed a sharp abdominal pain that radiates to his back. This feels the same as when he had ulcers in the past. He denies any darkening of his stools. Last bowel movement was yesterday. There is no gross blood seen in his bowel movements.  The history is provided by the patient. No language interpreter was used.    Past Medical History  Diagnosis Date  . Migraine    History reviewed. No pertinent past surgical history. History reviewed. No pertinent family history. History  Substance Use Topics  . Smoking status: Never Smoker   . Smokeless tobacco: Not on file  . Alcohol Use: No    Review of Systems  Constitutional: Negative for fever and chills.  Eyes: Positive for photophobia and visual disturbance ( blurry vision).  Respiratory: Negative for shortness of breath.   Cardiovascular: Negative for chest pain.  Gastrointestinal: Positive for abdominal pain. Negative for nausea, vomiting, diarrhea, constipation, blood in stool and anal bleeding.  Neurological: Positive for headaches.  Negative for weakness and numbness.  All other systems reviewed and are negative.    Allergies  Review of patient's allergies indicates no known allergies.  Home Medications   Current Outpatient Rx  Name  Route  Sig  Dispense  Refill  . Aspirin-Acetaminophen-Caffeine (GOODY HEADACHE PO)   Oral   Take 1 packet by mouth daily as needed (pain, body aches).         Marland Kitchen acetaminophen (TYLENOL) 500 MG tablet   Oral   Take 500 mg by mouth every 6 (six) hours as needed for pain (pain).          BP 152/98  Pulse 87  Temp(Src) 97.6 F (36.4 C) (Oral)  Resp 18  SpO2 99% Physical Exam  Nursing note and vitals reviewed. Constitutional: He is oriented to person, place, and time. He appears well-developed and well-nourished. No distress.  HENT:  Head: Normocephalic and atraumatic.  Right Ear: External ear normal.  Left Ear: External ear normal.  Nose: Nose normal.  No temporal artery tenderness  Eyes: Conjunctivae and EOM are normal. Pupils are equal, round, and reactive to light.  Neck: Normal range of motion. No tracheal deviation present.  No nuchal rigidity or meningeal signs  Cardiovascular: Normal rate, regular rhythm and normal heart sounds.   Pulmonary/Chest: Effort normal and breath sounds normal. No stridor.  Abdominal: Soft. He exhibits no distension. There is generalized tenderness. There is no rigidity, no rebound and no guarding.  Musculoskeletal: Normal range of motion.  Neurological: He is alert and oriented to person, place, and time. He has normal strength. He exhibits  normal muscle tone.  Finger nose finger normal. Rapid alternating movements normal. Heel-knee-shin normal.  Skin: Skin is warm and dry. He is not diaphoretic.  Psychiatric: He has a normal mood and affect. His behavior is normal.    ED Course  Procedures (including critical care time) Labs Review Labs Reviewed  CBC WITH DIFFERENTIAL - Abnormal; Notable for the following:    Hemoglobin 12.4 (*)     HCT 37.8 (*)    All other components within normal limits  COMPREHENSIVE METABOLIC PANEL - Abnormal; Notable for the following:    Total Bilirubin 0.1 (*)    All other components within normal limits  LIPASE, BLOOD  URINALYSIS, ROUTINE W REFLEX MICROSCOPIC   Imaging Review No results found.  EKG Interpretation   None       MDM   1. Headache   2. NSAID induced gastritis, initial encounter    Pt HA treated and improved while in ED.  Presentation is like pts typical HA and non concerning for [facility], ICH, Meningitis, or temporal arteritis. Pt is afebrile with no focal neuro deficits, nuchal rigidity, or change in vision. Pt is to follow up with PCP to discuss prophylactic medication. Pt verbalizes understanding and is agreeable with plan to dc. Patient also here for abdominal pain. Patient is nontoxic, nonseptic appearing, in no apparent distress.  Patient's pain and other symptoms adequately managed in emergency department.  Fluid bolus given.  Labs, imaging and vitals reviewed.  Patient does not meet the SIRS or Sepsis criteria.  On repeat exam patient does not have a surgical abdomen and there are no peritoneal signs.  No indication of appendicitis, bowel obstruction, bowel perforation, cholecystitis, diverticulitis. Likely an NSAID induced gastritis. Discussed importance of stopping Goody's Powder and no other NSAID use.  Patient discharged home with symptomatic treatment and given strict instructions for follow-up with their primary care physician.  I have also discussed reasons to return immediately to the ER.  Patient expresses understanding and agrees with plan.  Medications  sodium chloride 0.9 % bolus 1,000 mL (0 mLs Intravenous Stopped 02/03/13 1157)  dexamethasone (DECADRON) injection 10 mg (10 mg Intravenous Given 02/03/13 1111)  diphenhydrAMINE (BENADRYL) injection 25 mg (25 mg Intravenous Given 02/03/13 1112)  metoCLOPramide (REGLAN) injection 10 mg (10 mg Intravenous Given  02/03/13 1112)  pantoprazole (PROTONIX) EC tablet 40 mg (40 mg Oral Given 02/03/13 1147)          Mora Bellman, PA-C 02/04/13 1502

## 2013-02-03 NOTE — ED Notes (Signed)
Pt c/o HA starting this am with hx of similar in past; pt sts abd pain from taking goody powders; pt sts hx of ulcers

## 2013-02-05 NOTE — ED Provider Notes (Signed)
Medical screening examination/treatment/procedure(s) were performed by non-physician practitioner and as supervising physician I was immediately available for consultation/collaboration.     Geoffery Lyons, MD 02/05/13 [phone]

## 2013-02-12 ENCOUNTER — Emergency Department (HOSPITAL_COMMUNITY)
Admission: EM | Admit: 2013-02-12 | Discharge: 2013-02-13 | Disposition: A | Discharge status: Other Acute Inpatient Hospital | Payer: Self-pay | Attending: Emergency Medicine | Admitting: Emergency Medicine

## 2013-02-12 ENCOUNTER — Encounter (HOSPITAL_COMMUNITY): Payer: Self-pay | Admitting: Emergency Medicine

## 2013-02-12 DIAGNOSIS — R443 Hallucinations, unspecified: Secondary | ICD-10-CM | POA: Insufficient documentation

## 2013-02-12 DIAGNOSIS — G43909 Migraine, unspecified, not intractable, without status migrainosus: Secondary | ICD-10-CM | POA: Insufficient documentation

## 2013-02-12 DIAGNOSIS — Z79899 Other long term (current) drug therapy: Secondary | ICD-10-CM | POA: Insufficient documentation

## 2013-02-12 DIAGNOSIS — F29 Unspecified psychosis not due to a substance or known physiological condition: Secondary | ICD-10-CM | POA: Insufficient documentation

## 2013-02-12 DIAGNOSIS — F3289 Other specified depressive episodes: Secondary | ICD-10-CM | POA: Insufficient documentation

## 2013-02-12 DIAGNOSIS — R45851 Suicidal ideations: Secondary | ICD-10-CM | POA: Insufficient documentation

## 2013-02-12 DIAGNOSIS — Z872 Personal history of diseases of the skin and subcutaneous tissue: Secondary | ICD-10-CM | POA: Insufficient documentation

## 2013-02-12 DIAGNOSIS — IMO0002 Reserved for concepts with insufficient information to code with codable children: Secondary | ICD-10-CM | POA: Insufficient documentation

## 2013-02-12 DIAGNOSIS — F329 Major depressive disorder, single episode, unspecified: Secondary | ICD-10-CM | POA: Insufficient documentation

## 2013-02-12 HISTORY — DX: Reserved for concepts with insufficient information to code with codable children: IMO0002

## 2013-02-12 LAB — CBC
HCT: 37.4 % — ABNORMAL LOW (ref 39.0–52.0)
MCV: 87 fL (ref 78.0–100.0)
RBC: 4.3 MIL/uL (ref 4.22–5.81)
WBC: 5.9 10*3/uL (ref 4.0–10.5)

## 2013-02-12 LAB — COMPREHENSIVE METABOLIC PANEL
BUN: 12 mg/dL (ref 6–23)
CO2: 25 mEq/L (ref 19–32)
Chloride: 102 mEq/L (ref 96–112)
Creatinine, Ser: 1.04 mg/dL (ref 0.50–1.35)
GFR calc non Af Amer: >90 mL/min (ref >90)
Sodium: 138 mEq/L (ref 135–145)
Total Bilirubin: 0.1 mg/dL — ABNORMAL LOW (ref 0.3–1.2)
Total Protein: 7 g/dL (ref 6.0–8.3)

## 2013-02-12 LAB — ACETAMINOPHEN LEVEL: Acetaminophen (Tylenol), Serum: <15 ug/mL (ref 10–30)

## 2013-02-12 LAB — RAPID URINE DRUG SCREEN, HOSP PERFORMED
Barbiturates: NOT DETECTED
Benzodiazepines: NOT DETECTED

## 2013-02-12 LAB — ETHANOL: Alcohol, Ethyl (B): <11 mg/dL (ref 0–11)

## 2013-02-12 MED ORDER — HALOPERIDOL 5 MG PO TABS
5.0000 mg | ORAL_TABLET | Freq: Two times a day (BID) | ORAL | Status: DC
  Administered 2013-02-12: 5 mg via ORAL
  Filled 2013-02-12: qty 1

## 2013-02-12 MED ORDER — ONDANSETRON HCL 4 MG PO TABS: 4.0000 mg | ORAL_TABLET | Freq: Three times a day (TID) | ORAL | Status: DC | PRN

## 2013-02-12 MED ORDER — IBUPROFEN 800 MG PO TABS
800.0000 mg | ORAL_TABLET | Freq: Once | ORAL | Status: DC
  Filled 2013-02-12: qty 1

## 2013-02-12 MED ORDER — LORAZEPAM 1 MG PO TABS: 1.0000 mg | ORAL_TABLET | Freq: Three times a day (TID) | ORAL | Status: DC | PRN

## 2013-02-12 MED ORDER — NICOTINE 21 MG/24HR TD PT24
21.0000 mg | MEDICATED_PATCH | Freq: Every day | TRANSDERMAL | Status: DC
  Administered 2013-02-12: 21 mg via TRANSDERMAL
  Filled 2013-02-12: qty 1

## 2013-02-12 NOTE — ED Provider Notes (Signed)
CSN: [id number]     Arrival date & time 02/12/13  1816 History   First MD Initiated Contact with Patient 02/12/13 1852     Chief Complaint  Patient presents with  . Suicidal   (Consider location/radiation/quality/duration/timing/severity/associated sxs/prior Treatment) HPI Comments: Patient is 32 year old male with history of migraine headaches and ulcer who presents to the ED with his fiance who reports that over the last several months the patient has become progressively more depressed with episodes where he threatens to cut his own throat with a knife and will take the knife and "act like" he is cutting his throat.  She reports that over the past year he has lost his mother, his brother and a nephew which has started this.  He states that he believes that he is depressed but he does not understand his fiance's concern over his actions.  He states that he see and talks with his family members daily, he states they come while he is home alone and he does not know why other people cannot see them.  His fiance states that he is also complaining of hearing voices and has become increasingly paranoid about others spying on him.  When directly asked if he is suicidal he is evasive but replies "wouldn't you be".  The history is provided by the patient and the spouse. The history is limited by the condition of the patient. No language interpreter was used.    Past Medical History  Diagnosis Date  . Migraine   . Ulcer    No past surgical history on file. No family history on file. History  Substance Use Topics  . Smoking status: Never Smoker   . Smokeless tobacco: Not on file  . Alcohol Use: No    Review of Systems  Unable to perform ROS: Psychiatric disorder  Psychiatric/Behavioral: Positive for suicidal ideas, hallucinations, confusion and agitation. Negative for self-injury.    Allergies  Review of patient's allergies indicates no known allergies.  Home Medications   Current  Outpatient Rx  Name  Route  Sig  Dispense  Refill  . acetaminophen (TYLENOL) 500 MG tablet   Oral   Take 500 mg by mouth every 6 (six) hours as needed for pain (pain).         . Aspirin-Acetaminophen-Caffeine (GOODY HEADACHE PO)   Oral   Take 1 packet by mouth daily as needed (pain, body aches).         Marland Kitchen omeprazole (PRILOSEC) 20 MG capsule   Oral   Take 1 capsule (20 mg total) by mouth 2 (two) times daily before a meal.   30 capsule   0    BP 118/75  Pulse 104  Resp 16  Ht 5\' 7"  (1.702 m)  Wt 141 lb 14.4 oz (64.365 kg)  BMI 22.22 kg/m2  SpO2 100% Physical Exam  Nursing note and vitals reviewed. Constitutional: He appears well-developed and well-nourished.  agitated  HENT:  Head: Normocephalic and atraumatic.  Right Ear: External ear normal.  Left Ear: External ear normal.  Nose: Nose normal.  Mouth/Throat: Oropharynx is clear and moist. No oropharyngeal exudate.  Eyes: Conjunctivae are normal. Pupils are equal, round, and reactive to light. No scleral icterus.  Neck: Normal range of motion. Neck supple.  Cardiovascular: Normal rate, regular rhythm and normal heart sounds.  Exam reveals no gallop and no friction rub.   No murmur heard. Pulmonary/Chest: Effort normal and breath sounds normal. No respiratory distress. He has no wheezes. He has  no rales. He exhibits no tenderness.  Abdominal: Soft. Bowel sounds are normal. He exhibits no distension. There is no tenderness.  Musculoskeletal: Normal range of motion. He exhibits no edema and no tenderness.  Lymphadenopathy:    He has no cervical adenopathy.  Neurological: He is alert. He exhibits normal muscle tone. Coordination normal.  Skin: Skin is warm and dry. No rash noted. No erythema. No pallor.  Psychiatric: His affect is angry and labile. His speech is rapid and/or pressured and tangential. He is agitated and actively hallucinating. Thought content is paranoid and delusional. Cognition and memory are impaired. He  expresses impulsivity and inappropriate judgment. He expresses suicidal ideation. He expresses suicidal plans.    ED Course  Procedures (including critical care time) Labs Review Labs Reviewed  CBC  COMPREHENSIVE METABOLIC PANEL  ETHANOL  ACETAMINOPHEN LEVEL  SALICYLATE LEVEL  URINE RAPID DRUG SCREEN (HOSP PERFORMED)   Imaging Review No results found.  EKG Interpretation   None      7:27 PM Fiance will be heading to the magistrate's office now to take out IVC paperwork on the patient, have started psych labs, placed holding orders and requested sitter and transfer to POD C.  8:16 PM Care of the patient turned over to G. Veatrice Kells, NP, have spoken with Elijah Birk with ACT team who will be doing a telepsych consult on the patient with his finance in the room.  MDM  No diagnosis found.    Izola Price Marisue Humble, PA-C 02/12/13 2017
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
## 2013-02-12 NOTE — ED Notes (Signed)
Patient states he is hungry.

## 2013-02-12 NOTE — ED Notes (Addendum)
Tomasa Blase, NP is aware that the pt is in pain. This RN also called house coverage to check the availability of a sitter for the pt. Pt is talking with counselor at [facility] via telepsych.

## 2013-02-12 NOTE — BH Assessment (Signed)
[facility] Assessment Progress Note  At 20:02 I spoke to [facility], in anticipation of TTS assessment scheduled for 20:10.  Doylene Canning, MA Triage Specialist 02/12/2013 @ 20:07

## 2013-02-12 NOTE — ED Notes (Signed)
Spoke with Tom at [facility], as was told that the pt has been accepted to unit 406, bed 1. Pt needs IVC papers in order to be sent there. Tomasa Blase, NP is aware.

## 2013-02-12 NOTE — ED Notes (Addendum)
Pt brought here by fiance b/c she says he's thinking of hurting himself.  Lost his mother, brother and nephew within 3 months of each other.  Since then he is "seeing things" and wanting to hurt himself. Pt is angry and states he hurts all over.

## 2013-02-12 NOTE — ED Notes (Signed)
Sitter at bedside.

## 2013-02-13 ENCOUNTER — Inpatient Hospital Stay (HOSPITAL_COMMUNITY)
Admission: AD | Admit: 2013-02-13 | Discharge: 2013-02-17 | DRG: 881 | Disposition: A | Discharge status: Home / Self Care | Payer: Federal, State, Local not specified - Other | Source: Intra-hospital | Attending: Psychiatry | Admitting: Psychiatry

## 2013-02-13 ENCOUNTER — Encounter (HOSPITAL_COMMUNITY): Payer: Self-pay | Admitting: *Deleted

## 2013-02-13 DIAGNOSIS — R45851 Suicidal ideations: Secondary | ICD-10-CM

## 2013-02-13 DIAGNOSIS — F329 Major depressive disorder, single episode, unspecified: Principal | ICD-10-CM | POA: Diagnosis present

## 2013-02-13 DIAGNOSIS — F3289 Other specified depressive episodes: Principal | ICD-10-CM | POA: Diagnosis present

## 2013-02-13 DIAGNOSIS — F1220 Cannabis dependence, uncomplicated: Secondary | ICD-10-CM | POA: Diagnosis present

## 2013-02-13 DIAGNOSIS — F29 Unspecified psychosis not due to a substance or known physiological condition: Secondary | ICD-10-CM | POA: Diagnosis present

## 2013-02-13 DIAGNOSIS — F1994 Other psychoactive substance use, unspecified with psychoactive substance-induced mood disorder: Secondary | ICD-10-CM | POA: Diagnosis present

## 2013-02-13 DIAGNOSIS — F323 Major depressive disorder, single episode, severe with psychotic features: Secondary | ICD-10-CM

## 2013-02-13 HISTORY — DX: Mental disorder, not otherwise specified: F99

## 2013-02-13 MED ORDER — CITALOPRAM HYDROBROMIDE 10 MG PO TABS
10.0000 mg | ORAL_TABLET | Freq: Every day | ORAL | Status: DC
  Administered 2013-02-13 – 2013-02-15 (×3): 10 mg via ORAL
  Filled 2013-02-13 (×5): qty 1

## 2013-02-13 MED ORDER — MAGNESIUM HYDROXIDE 400 MG/5ML PO SUSP: 30.0000 mL | Freq: Every day | ORAL | Status: DC | PRN

## 2013-02-13 MED ORDER — INFLUENZA VAC SPLIT QUAD 0.5 ML IM SUSP
0.5000 mL | INTRAMUSCULAR | Status: DC
  Filled 2013-02-13: qty 0.5

## 2013-02-13 MED ORDER — PANTOPRAZOLE SODIUM 40 MG PO TBEC
40.0000 mg | DELAYED_RELEASE_TABLET | Freq: Every day | ORAL | Status: DC
  Administered 2013-02-13 – 2013-02-17 (×5): 40 mg via ORAL
  Filled 2013-02-13 (×7): qty 1

## 2013-02-13 MED ORDER — ALUM & MAG HYDROXIDE-SIMETH 200-200-20 MG/5ML PO SUSP: 30.0000 mL | ORAL | Status: DC | PRN

## 2013-02-13 MED ORDER — TRAZODONE HCL 50 MG PO TABS
50.0000 mg | ORAL_TABLET | Freq: Every evening | ORAL | Status: DC | PRN
  Filled 2013-02-13: qty 14

## 2013-02-13 MED ORDER — ACETAMINOPHEN 325 MG PO TABS
650.0000 mg | ORAL_TABLET | Freq: Four times a day (QID) | ORAL | Status: DC | PRN
  Administered 2013-02-13 – 2013-02-16 (×4): 650 mg via ORAL
  Filled 2013-02-13 (×5): qty 2

## 2013-02-13 MED ORDER — RISPERIDONE 1 MG PO TBDP
1.0000 mg | ORAL_TABLET | Freq: Every day | ORAL | Status: DC
  Administered 2013-02-13 – 2013-02-16 (×4): 1 mg via ORAL
  Filled 2013-02-13 (×6): qty 1

## 2013-02-13 NOTE — ED Notes (Signed)
IVC paper work faxed. RN received phone call asking for more information regarding the pt. Information provided.

## 2013-02-13 NOTE — ED Notes (Signed)
[facility] [employer] arrived and left the department with patient.

## 2013-02-13 NOTE — Progress Notes (Signed)
Patient ID: [name], male   DOB: 1980/12/04, 32 y.o.   MRN: [id number] D. Patient presents with depressed mood, affect blunted. Pt in am states '' Well I'm okay I guess, I'm really just tired I think what I needed was sleep, my two kids keep me from sleeping and I think that's what's been affecting me. '' Pt has been isolative to room, in bed throughout rest of shift. Pt denies any SI/ HI or A/V Hallucinations at this time. Pt completed self inventory and rates depression at 8/10 on depression scale, 10 being worst depression   A. Support and encouragement provided. Discussed above information with MD. R. Patient in no acute distress at this time. Will continue to monitor q 15 minutes for safety.

## 2013-02-13 NOTE — Progress Notes (Signed)
32 year old male pt admitted due to the urging of his fiance. Pt reports that fiance has been concerned about him, states that he has been seeing and talking to his dead relatives recently. Pt does endorse depression and passive SI on admission but able to contract for safety while on the unit. Pt reports that he never really had voices in the past but since the passing of his mother the voices have gotten progressively worse. Pt reports that he has been unemployed recently and is having difficulties holding down a job and reports financial difficulties because of this. Pt denies any medical problems, states he has never been to an in-patient psychiatric facility before, denies that he is suppose to be on any medications and denies any ETOH usage. Pt was oriented to the unit and safety maintained.

## 2013-02-13 NOTE — [facility] Group Notes (Signed)
[facility] Group Notes: (Clinical Social Work)   02/13/2013      Type of Therapy:  Group Therapy   Participation Level:  Did Not Attend    Ambrose Mantle, LCSW 02/13/2013, 1:15 PM
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
## 2013-02-13 NOTE — Progress Notes (Signed)
Patient ID: [name], male   DOB: [birth year], 32 y.o.   MRN: [id number] Psychoeducational Group Note  Date:  02/13/2013 Time:0930am  Group Topic/Focus:  Identifying Needs:   The focus of this group is to help patients identify their personal needs that have been historically problematic and identify healthy behaviors to address their needs.  Participation Level:  Did Not Attend  Participation Quality:    Affect:   Cognitive:  Insight:Engagement in Group: Additional Comments:  Inventory and Psychoeducational group   Valente David 02/13/2013,9:59 AM

## 2013-02-13 NOTE — Tx Team (Signed)
Initial Interdisciplinary Treatment Plan  PATIENT STRENGTHS: (choose at least two) Ability for insight Average or above average intelligence Capable of independent living Supportive family/friends  PATIENT STRESSORS: Loss of family members   PROBLEM LIST: Problem List/Patient Goals Date to be addressed Date deferred Reason deferred Estimated date of resolution  Depression 02/13/13     A/V hallucinations 02/13/13                                                DISCHARGE CRITERIA:  Ability to meet basic life and health needs Improved stabilization in mood, thinking, and/or behavior Verbal commitment to aftercare and medication compliance  PRELIMINARY DISCHARGE PLAN: Attend aftercare/continuing care group Return to previous living arrangement  PATIENT/FAMIILY INVOLVEMENT: This treatment plan has been presented to and reviewed with Jonathan patient, [name], and/or family member, .  Jonathan patient and family have been given Jonathan opportunity to ask questions and make suggestions.  Emeka Lindner, Kevin 02/13/2013, 3:06 AM

## 2013-02-13 NOTE — ED Notes (Signed)
[facility] called to transport pt to [facility]. 

## 2013-02-13 NOTE — ED Notes (Signed)
[employer] at bedside to present IVC paperwork.

## 2013-02-13 NOTE — [facility] Suicide Risk Assessment (Signed)
Suicide Risk Assessment  Admission Assessment     Nursing information obtained from:    Demographic factors:    Current Mental Status:    Loss Factors:    Historical Factors:    Risk Reduction Factors:     CLINICAL FACTORS:   Dysthymia Alcohol/Substance Abuse/Dependencies More than one psychiatric diagnosis Unstable or Poor Therapeutic Relationship  COGNITIVE FEATURES THAT CONTRIBUTE TO RISK:  Closed-mindedness Polarized thinking    SUICIDE RISK:   Moderate:  Frequent suicidal ideation with limited intensity, and duration, some specificity in terms of plans, no associated intent, good self-control, limited dysphoria/symptomatology, some risk factors present, and identifiable protective factors, including available and accessible social support.  PLAN OF CARE:  I certify that inpatient services furnished can reasonably be expected to improve the patient's condition.  [name] 02/13/2013, 9:58 AM
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
## 2013-02-14 NOTE — Progress Notes (Signed)
[facility] MD Progress Note  02/14/2013 12:29 PM [name]  MRN:  [id number] Subjective:  Admitted for depression and using marijuna. Says has been feeling somewhat better. Affect is bright. Taking celexa and risperdal with no reported side effect Diagnosis:   DSM5: Schizophrenia Disorders:  Brief Psychotic Disorder (298.8) Obsessive-Compulsive Disorders:   Trauma-Stressor Disorders:   Substance/Addictive Disorders:  Cannabis Use Disorder - Moderate 9304.30) Depressive Disorders:  Disruptive Mood Dysregulation Disorder (296.99)  Axis I: Rule out Major Depression and Substance Induced Mood Disorder  ADL's:  Intact  Sleep: Good  Appetite:  Fair  Suicidal Ideation:  Plan:  denies Intent:  denies Homicidal Ideation:  Plan:  denies Intent:  denies AEB (as evidenced by):  Psychiatric Specialty Exam: Review of Systems  Psychiatric/Behavioral: Positive for depression. The patient is nervous/anxious.     Blood pressure 146/95, pulse 93, temperature 97.1 F (36.2 C), temperature source Oral, resp. rate 20, height 5\' 8"  (1.727 m), weight 62.143 kg (137 lb).Body mass index is 20.84 kg/(m^2).  General Appearance: Casual  Eye Contact::  Good  Speech:  Slow  Volume:  Decreased  Mood:  Dysphoric  Affect:  Congruent  Thought Process:  Coherent  Orientation:  Full (Time, Place, and Person)  Thought Content:  Rumination  Suicidal Thoughts:  No  Homicidal Thoughts:  No  Memory:  Recent;   Good  Judgement:  Poor  Insight:  Shallow  Psychomotor Activity:  Decreased  Concentration:  Fair  Recall:  Fair  Akathisia:  No  Handed:  Right  AIMS (if indicated):     Assets:  Desire for Improvement Resilience  Sleep:  Number of Hours: 6.75   Current Medications: Current Facility-Administered Medications  Medication Dose Route Frequency Provider Last Rate Last Dose  . acetaminophen (TYLENOL) tablet 650 mg  650 mg Oral Q6H PRN Kristeen Mans, NP   650 mg at 02/13/13 0345  . alum & mag  hydroxide-simeth (MAALOX/MYLANTA) 200-200-20 MG/5ML suspension 30 mL  30 mL Oral Q4H PRN Kristeen Mans, NP      . citalopram (CELEXA) tablet 10 mg  10 mg Oral Daily Verne Spurr, PA-C   10 mg at 02/14/13 0815  . influenza vac split quadrivalent PF (FLUARIX) injection 0.5 mL  0.5 mL Intramuscular Tomorrow-1000 Mojeed Akintayo      . magnesium hydroxide (MILK OF MAGNESIA) suspension 30 mL  30 mL Oral Daily PRN Kristeen Mans, NP      . pantoprazole (PROTONIX) EC tablet 40 mg  40 mg Oral Daily Kristeen Mans, NP   40 mg at 02/14/13 0815  . risperiDONE (RISPERDAL M-TABS) disintegrating tablet 1 mg  1 mg Oral QHS Verne Spurr, PA-C   1 mg at 02/13/13 2224  . traZODone (DESYREL) tablet 50 mg  50 mg Oral QHS PRN Kristeen Mans, NP        Lab Results:  Results for orders placed during the hospital encounter of 02/12/13 (from the past 48 hour(s))  CBC     Status: Abnormal   Collection Time    02/12/13  7:05 PM      Result Value Range   WBC 5.9  4.0 - 10.5 K/uL   RBC 4.30  4.22 - 5.81 MIL/uL   Hemoglobin 12.3 (*) 13.0 - 17.0 g/dL   HCT 78.2 (*) 95.6 - 21.3 %   MCV 87.0  78.0 - 100.0 fL   MCH 28.6  26.0 - 34.0 pg   MCHC 32.9  30.0 - 36.0 g/dL  RDW 14.2  11.5 - 15.5 %   Platelets 241  150 - 400 K/uL  COMPREHENSIVE METABOLIC PANEL     Status: Abnormal   Collection Time    02/12/13  7:05 PM      Result Value Range   Sodium 138  135 - 145 mEq/L   Potassium 4.0  3.5 - 5.1 mEq/L   Chloride 102  96 - 112 mEq/L   CO2 25  19 - 32 mEq/L   Glucose, Bld 100 (*) 70 - 99 mg/dL   BUN 12  6 - 23 mg/dL   Creatinine, Ser 1.61  0.50 - 1.35 mg/dL   Calcium 9.6  8.4 - 09.6 mg/dL   Total Protein 7.0  6.0 - 8.3 g/dL   Albumin 3.8  3.5 - 5.2 g/dL   AST 16  0 - 37 U/L   ALT 9  0 - 53 U/L   Alkaline Phosphatase 59  39 - 117 U/L   Total Bilirubin 0.1 (*) 0.3 - 1.2 mg/dL   GFR calc non Af Amer >90  >90 mL/min   GFR calc Af Amer >90  >90 mL/min   Comment: (NOTE)     The eGFR has been calculated using the CKD  EPI equation.     This calculation has not been validated in all clinical situations.     eGFR's persistently <90 mL/min signify possible Chronic Kidney     Disease.  ETHANOL     Status: None   Collection Time    02/12/13  7:05 PM      Result Value Range   Alcohol, Ethyl (B) <11  0 - 11 mg/dL   Comment:            LOWEST DETECTABLE LIMIT FOR     SERUM ALCOHOL IS 11 mg/dL     FOR MEDICAL PURPOSES ONLY  ACETAMINOPHEN LEVEL     Status: None   Collection Time    02/12/13  7:05 PM      Result Value Range   Acetaminophen (Tylenol), Serum <15.0  10 - 30 ug/mL   Comment:            THERAPEUTIC CONCENTRATIONS VARY     SIGNIFICANTLY. A RANGE OF 10-30     ug/mL MAY BE AN EFFECTIVE     CONCENTRATION FOR MANY PATIENTS.     HOWEVER, SOME ARE BEST TREATED     AT CONCENTRATIONS OUTSIDE THIS     RANGE.     ACETAMINOPHEN CONCENTRATIONS     >150 ug/mL AT 4 HOURS AFTER     INGESTION AND >50 ug/mL AT 12     HOURS AFTER INGESTION ARE     OFTEN ASSOCIATED WITH TOXIC     REACTIONS.  SALICYLATE LEVEL     Status: Abnormal   Collection Time    02/12/13  7:05 PM      Result Value Range   Salicylate Lvl <2.0 (*) 2.8 - 20.0 mg/dL  URINE RAPID DRUG SCREEN (HOSP PERFORMED)     Status: Abnormal   Collection Time    02/12/13  7:34 PM      Result Value Range   Opiates POSITIVE (*) NONE DETECTED   Cocaine NONE DETECTED  NONE DETECTED   Benzodiazepines NONE DETECTED  NONE DETECTED   Amphetamines POSITIVE (*) NONE DETECTED   Tetrahydrocannabinol POSITIVE (*) NONE DETECTED   Barbiturates NONE DETECTED  NONE DETECTED   Comment:  DRUG SCREEN FOR MEDICAL PURPOSES     ONLY.  IF CONFIRMATION IS NEEDED     FOR ANY PURPOSE, NOTIFY LAB     WITHIN 5 DAYS.                LOWEST DETECTABLE LIMITS     FOR URINE DRUG SCREEN     Drug Class       Cutoff (ng/mL)     Amphetamine      1000     Barbiturate      200     Benzodiazepine   200     Tricyclics       300     Opiates          300     Cocaine           300     THC              50    Physical Findings: AIMS: Facial and Oral Movements Muscles of Facial Expression: None, normal Lips and Perioral Area: None, normal Jaw: None, normal Tongue: None, normal,Extremity Movements Upper (arms, wrists, hands, fingers): None, normal Lower (legs, knees, ankles, toes): None, normal, Trunk Movements Neck, shoulders, hips: None, normal, Overall Severity Severity of abnormal movements (highest score from questions above): None, normal Incapacitation due to abnormal movements: None, normal Patient's awareness of abnormal movements (rate only patient's report): No Awareness, Dental Status Current problems with teeth and/or dentures?: No Does patient usually wear dentures?: No  CIWA:    COWS:     Treatment Plan Summary: Daily contact with patient to assess and evaluate symptoms and progress in treatment Medication management. Continue celexa and risperdal. Possible discharge in 2 days.  Plan:  Medical Decision Making Problem Points:  Established problem, worsening (2), Review of last therapy session (1) and Review of psycho-social stressors (1) Data Points:  Review of medication regiment & side effects (2) Review of new medications or change in dosage (2)  I certify that inpatient services furnished can reasonably be expected to improve the patient's condition.   Dayton Sherr 02/14/2013, 12:29 PM

## 2013-02-14 NOTE — Progress Notes (Signed)
Pt has remained in bed asleep all evening. Awakens easily, denies complaints but is guarded. States he's no longer hearing or seeing his dead mother. Affect flat, mood depressed. Pt supported, reassured. New orders for celexa and risperdal received and administered without difficulty. Pt denies SI/HI and remains safe. Lawrence Marseilles

## 2013-02-14 NOTE — Progress Notes (Signed)
Patient ID: [name], male   DOB: [birth year], 32 y.o.   MRN: [id number] Psychoeducational Group Note  Date:  02/14/2013 Time:  0930am  Group Topic/Focus:  Making Healthy Choices:   The focus of this group is to help patients identify negative/unhealthy choices they were using prior to admission and identify positive/healthier coping strategies to replace them upon discharge.  Participation Level:  Active  Participation Quality:  Appropriate  Affect:  Appropriate  Cognitive:  Appropriate  Insight:  Supportive  Engagement in Group:  Supportive  Additional Comments:  Inventory and Psychoeducational group   Valente David 02/14/2013,10:38 AM

## 2013-02-14 NOTE — [facility] Group Notes (Signed)
[facility] Group Notes:  (Clinical Social Work)  02/14/2013   11:15am-12:00pm  Summary of Progress/Problems:  The main focus of today's process group was to listen to a variety of genres of music and to identify that different types of music provoke different responses.  The patient then was able to identify personally what was soothing for them, as well as energizing.  Handouts were used to record feelings evoked, as well as how patient can personally use this knowledge in sleep habits, with depression, and with other symptoms.  The patient expressed understanding of concepts, as well as knowledge of how each type of music affected them and how this can be used when they are at home as a tool in their recovery.  [name] participated fully and enjoyed almost every type of music played, either tapping his pencil in time, or smiling broadly throughout.    Type of Therapy:  Music Therapy   Participation Level:  Active  Participation Quality:  Attentive and Sharing  Affect:  Blunted  Cognitive:  Oriented  Insight:  Engaged  Engagement in Therapy:  Engaged  Modes of Intervention:   Activity, Exploration  Ambrose Mantle, LCSW 02/14/2013, 12:30pm

## 2013-02-14 NOTE — Progress Notes (Signed)
NUTRITION ASSESSMENT  Pt identified as at risk on the Malnutrition Screen Tool  INTERVENTION: 1. Educated patient on the importance of nutrition and encouraged intake of food and beverages. 2. Discussed weight goals.  NUTRITION DIAGNOSIS: Unintentional weight loss related to sub-optimal intake as evidenced by pt report.   Goal: Pt to meet >/= 90% of their estimated nutrition needs.  Monitor:  PO intake  Assessment:  Patient admitted with MDD with psychotic features.  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  Patient reports good appetite and intake currently and prior to admit.  UBW 140 lbs and denies weight loss although chart indicates a 3 lb weight loss this week and a 10 lb weight loss over the past 15 months.  32 y.o. male  Height: Ht Readings from Last 1 Encounters:  02/13/13 5\' 8"  (1.727 m)    Weight: Wt Readings from Last 1 Encounters:  02/13/13 137 lb (62.143 kg)    Weight Hx: Wt Readings from Last 10 Encounters:  02/13/13 137 lb (62.143 kg)  02/12/13 141 lb 14.4 oz (64.365 kg)  12/15/12 145 lb (65.772 kg)  11/29/11 148 lb (67.132 kg)    BMI:  Body mass index is 20.84 kg/(m^2). Pt meets criteria for wnl based on current BMI.  Estimated Nutritional Needs: Kcal: 25-30 kcal/kg Protein: > 1 gram protein/kg Fluid: 1 ml/kcal  Diet Order: General Pt is also offered choice of unit snacks mid-morning and mid-afternoon.  Pt is eating as desired.   Lab results and medications reviewed.   Oran Rein, RD, LDN Clinical Inpatient Dietitian Pager:  [phone] Weekend and after hours pager:  [phone]

## 2013-02-14 NOTE — Progress Notes (Signed)
Patient ID: [name], male   DOB: [birth year], 32 y.o.   MRN: [id number] D. Patient presents with depressed mood, somewhat irritable, affect blunted. Pt in am states ''I'm ready to go home. Tell the doctor I need to go home, I'm fine. I slept fine. I want to go home and start cooking thanksgiving food and be with my kids'' When discussing plan of care with patient, and wellness strategies for discharge, pt agreed '' I know smoking is a bad thing for me, and I keep saying I'm going to stop doing it but I do it, I know it isn't good for me''  Pt denies any SI/ HI or A/V Hallucinations at this time. Pt completed self inventory and rates depression at 1/10 on depression scale, 10 being worst depression A. Support and encouragement provided. Discussed above information with MD. R. Patient in no acute distress at this time. Has attended unit programming with encouragement today. Will continue to monitor q 15 minutes for safety.

## 2013-02-14 NOTE — Progress Notes (Signed)
Adult Psychoeducational Group Note  Date:  02/14/2013 Time:  8:00 pm  Group Topic/Focus:  Wrap-Up Group:   The focus of this group is to help patients review their daily goal of treatment and discuss progress on daily workbooks.  Participation Level:  Active  Participation Quality:  Appropriate and Sharing  Affect:  Appropriate  Cognitive:  Appropriate  Insight: Appropriate  Engagement in Group:  Engaged  Modes of Intervention:  Discussion, Education, Socialization and Support  Additional Comments:  Pt stated that he has been hearing voices and seeing things. Pt stated that he is down to earth and respectful to others.   Laural Benes, Kimi Bordeau 02/14/2013, 10:55 PM

## 2013-02-14 NOTE — Progress Notes (Signed)
Found pt resting in bed asleep with lights off. Pt avoids eye contact, forwards little information. Refusing group. Pt supported however reminded pt that part of readiness for discharge is showing investment in treatment and getting well. Pt still refusing to get up and attend group. He denies any pain or problems. Also denying AVH/SI/HI though with his focus on discharge it is possible he is minimizing his symptoms. He remains safe at this time. Will medicate with scheduled risperdal at 2200. Lawrence Marseilles

## 2013-02-15 DIAGNOSIS — F39 Unspecified mood [affective] disorder: Secondary | ICD-10-CM

## 2013-02-15 DIAGNOSIS — F23 Brief psychotic disorder: Secondary | ICD-10-CM

## 2013-02-15 DIAGNOSIS — F1220 Cannabis dependence, uncomplicated: Secondary | ICD-10-CM

## 2013-02-15 MED ORDER — CITALOPRAM HYDROBROMIDE 20 MG PO TABS
20.0000 mg | ORAL_TABLET | Freq: Every day | ORAL | Status: DC
  Administered 2013-02-16 – 2013-02-17 (×2): 20 mg via ORAL
  Filled 2013-02-15 (×3): qty 1

## 2013-02-15 NOTE — Tx Team (Signed)
  Interdisciplinary Treatment Plan Update   Date Reviewed:  02/15/2013  Time Reviewed:  8:14 AM  Progress in Treatment:   Attending groups: Yes Participating in groups: Yes Taking medication as prescribed: Yes  Tolerating medication: Yes Family/Significant other contact made: No Patient understands diagnosis: Yes AEB asking for help with voices and depression Discussing patient identified problems/goals with staff: Yes Medical problems stabilized or resolved: Yes Denies suicidal/homicidal ideation: Yes  In tx team Patient has not harmed self or others: Yes  For review of initial/current patient goals, please see plan of care.  Estimated Length of Stay:  4-5 days  Reason for Continuation of Hospitalization: Depression Hallucinations Medication stabilization  New Problems/Goals identified:  N/A  Discharge Plan or Barriers:   return home, follow up outpt  Additional Comments:  32 year old male pt admitted due to the urging of his fiance. Pt reports that fiance has been concerned about him, states that he has been seeing and talking to his dead relatives recently. Pt does endorse depression and passive SI on admission but able to contract for safety while on the unit. Pt reports that he never really had voices in the past but since the passing of his mother the voices have gotten progressively worse. Pt reports that he has been unemployed recently and is having difficulties holding down a job and reports financial difficulties because of this.   Attendees:  Signature: Thedore Mins, MD 02/15/2013 8:14 AM   Signature: Richelle Ito, LCSW 02/15/2013 8:14 AM  Signature: Fransisca Kaufmann, NP 02/15/2013 8:14 AM  Signature: Joslyn Devon, RN 02/15/2013 8:14 AM  Signature: Liborio Nixon, RN 02/15/2013 8:14 AM  Signature:  02/15/2013 8:14 AM  Signature:   02/15/2013 8:14 AM  Signature:    Signature:    Signature:    Signature:    Signature:    Signature:      Scribe for Treatment  Team:   Richelle Ito, LCSW  02/15/2013 8:14 AM

## 2013-02-15 NOTE — Progress Notes (Signed)
Recreation Therapy Notes  Date: 11.24.2014 Time: 9:30am Location: [address] Dayroom  Group Topic: Wellness  Goal Area(s) Addresses:  Patient will define components of whole wellness. Patient will verbalize benefit of whole wellness.  Behavioral Response: Attentive, Engaged, Appropriate  Intervention: Group Mind Map  Activity: Patients were asked to create a group flow chart highlighting the dimensions of wellness, as well as things they can do to personally invest in wellness.    Education: Wellness, Personal Care  Education Outcome: Acknowledges understanding   Clinical Observations/Feedback: As a group patients identified physical, mental, emotional and spiritual health as parts of wellness. Patient actively engaged in group activity, defining dimensions of wellness and giving examples to fit in each category. Patient agreed with peer who highlighted the importance of balance in ones life, patient related this to his relationships and his support system post d/c.   [name] Beatric Fulop, LRT/CTRS  Lisa Blakeman L 02/15/2013 4:12 PM

## 2013-02-15 NOTE — [facility] Group Notes (Signed)
[facility] LCSW Group Therapy  02/15/2013 1:15 pm  Type of Therapy: Process Group Therapy  Participation Level:  Active  Participation Quality:  Appropriate  Affect:  Flat  Cognitive:  Oriented  Insight:  Improving  Engagement in Group:  Limited  Engagement in Therapy:  Limited  Modes of Intervention:  Activity, Clarification, Education, Problem-solving and Support  Summary of Progress/Problems: Today's group addressed the issue of overcoming obstacles.  Patients were asked to identify their biggest obstacle post d/c that stands in the way of their on-going success, and then problem solve as to how to manage this.  Brace identified his short term goal as getting out of hospital, and his obstacle not getting out.  Long term is working.  Strength is hard worker, children and saying "tomorrow is a new day, no need to sweat today."  This because he was using his son's birthday as his argument to be released today, and he while he prefers to be there, he is rolling with it."  [name] 02/15/2013   2:14 PM

## 2013-02-15 NOTE — Progress Notes (Signed)
D: Patient in dayroom on approach.  Patient states his day was wonderful.  Patient calm and cooperative.  Patient states he has had a pleasant time here and states that the people make it a good place.  Patient states he has learned to mind his own business.  Patient denies SI/HI and denies AVH.   A: Staff to monitor Q 15 mins for safety.  Encouragement and support offered.  Scheduled medications administered per orders.  Tylenol administered for a headache.   R: Patient remains safe on the unit.  Patient attended group tonight.  Patient visible on the unit and interacting with peers.  Patient taking administered medication.

## 2013-02-15 NOTE — Progress Notes (Signed)
The focus of this group is to help patients review their daily goal of treatment and discuss progress on daily workbooks. Pt attended the evening group session and responded to all discussion prompts from the Writer. Pt shared that today had been an okay day on the unit, though he could not specify any reason or thing as to why it was. Pt's only additional request from Nursing Staff was to be allowed to shave, which he was following group. Pt's only goal this week was to discharge home so that he could spend Thanksgiving with his family. Pt's affect was neutral.

## 2013-02-15 NOTE — Progress Notes (Signed)
D: Pt presents with flat affect and depressed mood. Pt denies SI/HI/AVH. Pt minimizes his problems and continues to request for discharge. Pt stated that he have been here for three days voluntarily and now he is ready to go home. Pt forwards little information, guarded and has minimal interaction on the milieu. No complaints verbalized by pt. No adverse reactions to meds verbalized by pt. Pt compliant with taking meds and attending groups. A: Medications administered as ordered per MD. Verbal support given. Pt encouraged to attend groups. 15 minute checks performed for safety. R: Pt safety maintained.

## 2013-02-15 NOTE — Progress Notes (Signed)
Patient ID: [name], male   DOB: 1980/04/09, 32 y.o.   MRN: [id number] [facility] MD Progress Note  02/15/2013 11:37 AM [name]  MRN:  [id number] Subjective:  Patient states "I'm ready to go home. I'm fine now that I have had some sleep. I got stressed out taking care of my six kids and I was not sleeping. Now I realize that I need to ask for some help from my family because I was more overwhelmed than I realized. Today is one of my children's birthday and I can't miss that. I need the MD to discharge me."   Objective:  Patient per notes in epic has been focused on going home since yesterday appearing to minimize his reasons for admission. He does not volunteer any information about multiple substances being present in his urine including amphetamines. Over the weekend patient was guarded and minimally cooperative with his treatment. Today patient immediately starts defending his reasons for wanting to go home becoming mildly agitated when finding out he is not down to leave today. After being allowed to vent he was able to process the concerns treatment team has about his leaving such as his presentation that including suicidal thoughts and visual hallucinations, which patient is now denying.   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   HENT: Negative.   Eyes: Negative.   Respiratory: Negative.   Cardiovascular: Negative.    Gastrointestinal: Negative.   Genitourinary: Negative.   Musculoskeletal: Negative.   Skin: Negative.   Neurological: Negative.   Endo/Heme/Allergies: Negative.   Psychiatric/Behavioral: Positive for depression and substance abuse. Negative for suicidal ideas, hallucinations and memory loss. The patient is nervous/anxious. The patient does not have insomnia.     Blood pressure 139/86, pulse 84, temperature 97.3 F (36.3 C), temperature source Oral, resp. rate 16, height 5\' 8"  (1.727 m), weight 62.143 kg (137 lb).Body mass index is 20.84 kg/(m^2).  General Appearance: Casual  Eye Contact::  Good  Speech:  Slow  Volume:  Decreased  Mood:  Dysphoric  Affect:  Congruent  Thought Process:  Coherent  Orientation:  Full (Time, Place, and Person)  Thought Content:  Rumination  Suicidal Thoughts:  No  Homicidal Thoughts:  No  Memory:  Recent;   Good  Judgement:  Poor  Insight:  Shallow  Psychomotor Activity:  Decreased  Concentration:  Fair  Recall:  Fair  Akathisia:  No  Handed:  Right  AIMS (if indicated):     Assets:  Desire for Improvement Resilience  Sleep:  Number of Hours: 6.25   Current Medications: Current Facility-Administered Medications  Medication Dose Route Frequency Provider Last Rate Last Dose  . acetaminophen (TYLENOL) tablet 650 mg  650 mg Oral Q6H PRN Kristeen Mans, NP   650 mg at 02/13/13 0345  . alum & mag hydroxide-simeth (MAALOX/MYLANTA) 200-200-20 MG/5ML suspension 30 mL  30 mL Oral Q4H PRN Kristeen Mans, NP      . Melene Muller ON 02/16/2013] citalopram (CELEXA) tablet 20 mg  20 mg Oral Daily Besan Ketchem      . influenza vac split quadrivalent PF (FLUARIX) injection 0.5 mL  0.5 mL Intramuscular Tomorrow-1000 Claudene Gatliff      . magnesium hydroxide (MILK OF MAGNESIA) suspension 30 mL  30 mL Oral Daily PRN Kristeen Mans, NP      . pantoprazole (PROTONIX) EC tablet 40 mg  40 mg Oral Daily Kristeen Mans, NP   40 mg at 02/15/13 1610  . risperiDONE (RISPERDAL  M-TABS) disintegrating tablet 1 mg  1 mg Oral QHS Verne Spurr, PA-C   1 mg at 02/14/13 2132  . traZODone (DESYREL) tablet 50 mg  50 mg Oral QHS PRN Kristeen Mans, NP        Lab Results:  No results found for this or any previous visit (from the past 48 hour(s)).  Physical Findings: AIMS: Facial and Oral Movements Muscles of Facial Expression: None, normal Lips and Perioral Area: None, normal Jaw: None, normal Tongue: None, normal,Extremity Movements Upper (arms, wrists, hands, fingers): None, normal Lower (legs, knees, ankles, toes): None, normal, Trunk Movements Neck, shoulders, hips: None, normal, Overall Severity Severity of abnormal movements (highest score from questions above): None, normal Incapacitation due to abnormal movements: None, normal Patient's awareness of abnormal movements (rate only patient's report): No Awareness, Dental Status Current problems with teeth and/or dentures?: No Does patient usually wear dentures?: No  CIWA:    COWS:     Treatment Plan Summary: Daily contact with patient to assess and evaluate symptoms and progress in treatment Medication management.  Plan: Continue crisis management and stabilization.  Medication management: Reviewed with patient who stated no untoward effects. Continue Celexa 10 mg for depressive/anxious symptoms and Risperdal 1 mg at hs for psychosis.  Encouraged patient to attend groups and participate in group counseling sessions and activities.  Discharge plan in progress.  Continue current treatment plan.  Address health issues: Vitals reviewed and stable.   Medical Decision Making Problem Points:  Established problem, stable/improving (1) and Review of psycho-social stressors (1) Data Points:  Review of medication regiment & side effects (2) Review of new medications or change in dosage (2)  I certify that inpatient services furnished can reasonably be expected to improve the patient's condition.   DAVIS, LAURA  NP-C 02/15/2013, 11:37 AM Seen and agreed. Thedore Mins, MD

## 2013-02-16 DIAGNOSIS — F1994 Other psychoactive substance use, unspecified with psychoactive substance-induced mood disorder: Secondary | ICD-10-CM

## 2013-02-16 DIAGNOSIS — F329 Major depressive disorder, single episode, unspecified: Secondary | ICD-10-CM

## 2013-02-16 NOTE — [facility] Suicide Risk Assessment (Signed)
[facility] INPATIENT:  Family/Significant Other Suicide Prevention Education  Suicide Prevention Education:  Education Completed; [name], fiance, [id number] has been identified by the patient as the family member/significant other with whom the patient will be residing, and identified as the person(s) who will aid the patient in the event of a mental health crisis (suicidal ideations/suicide attempt).  With written consent from the patient, the family member/significant other has been provided the following suicide prevention education, prior to the and/or following the discharge of the patient.  The suicide prevention education provided includes the following:  Suicide risk factors  Suicide prevention and interventions  National Suicide Hotline telephone number  [facility] assessment telephone number  [facility] Emergency Assistance 911  [name] and/or Residential Mobile Crisis Unit telephone number  Request made of family/significant other to:  Remove weapons (e.g., guns, rifles, knives), all items previously/currently identified as safety concern.    Remove drugs/medications (over-the-counter, prescriptions, illicit drugs), all items previously/currently identified as a safety concern.  The family member/significant other verbalizes understanding of the suicide prevention education information provided.  The family member/significant other agrees to remove the items of safety concern listed above.  [name] 02/16/2013, 10:56 AM

## 2013-02-16 NOTE — Progress Notes (Signed)
Patient ID: [name], male   DOB: 1980/09/17, 32 y.o.   MRN: [id number] [facility] MD Progress Note  02/16/2013 11:01 AM [name]  MRN:  [id number] Subjective: "I am still having anxiety and worry but my depression is resolving." Objective:  Patient reports decreasing depressive symptoms and suicidal thoughts. However, he reports ongoing anxiety and worries. He denies delusions and psychotic symptoms but he seems to minimize his symptoms. Patient is compliant with his medications and did not endorse any adverse reactions. Diagnosis:   DSM5: Schizophrenia Disorders:  Brief Psychotic Disorder (298.8) Obsessive-Compulsive Disorders:   Trauma-Stressor Disorders:   Substance/Addictive Disorders:  Cannabis Use Disorder - Moderate 9304.30) Depressive Disorders:  Disruptive Mood Dysregulation Disorder (296.99)  Axis I: Major Depressive disorder.  Substance Induced Mood Disorder  ADL's:  Intact  Sleep: Good  Appetite:  Fair  Suicidal Ideation:  Plan:  denies Intent:  denies Homicidal Ideation:  Plan:  denies Intent:  denies AEB (as evidenced by):  Psychiatric Specialty Exam: Review of Systems  Constitutional: Negative.   HENT: Negative.   Eyes: Negative.   Respiratory: Negative.   Cardiovascular: Negative.   Gastrointestinal: Negative.   Genitourinary: Negative.   Musculoskeletal: Negative.   Skin: Negative.   Neurological: Negative.   Endo/Heme/Allergies: Negative.   Psychiatric/Behavioral: Positive for depression and substance abuse. Negative for suicidal ideas, hallucinations and memory loss. The patient is nervous/anxious. The patient does not have insomnia.     Blood pressure 148/88, pulse 112, temperature 97.6 F (36.4 C), temperature source Oral, resp. rate 22, height 5\' 8"  (1.727 m), weight 62.143 kg (137 lb).Body mass index is 20.84 kg/(m^2).  General Appearance: Casual  Eye Contact::  Good  Speech:  Slow  Volume:  Decreased  Mood:  Dysphoric  Affect:  Congruent   Thought Process:  Coherent  Orientation:  Full (Time, Place, and Person)  Thought Content:  Rumination  Suicidal Thoughts:  No  Homicidal Thoughts:  No  Memory:  Recent;   Good  Judgement:  Poor  Insight:  Shallow  Psychomotor Activity:  Decreased  Concentration:  Fair  Recall:  Fair  Akathisia:  No  Handed:  Right  AIMS (if indicated):     Assets:  Desire for Improvement Resilience  Sleep:  Number of Hours: 6.75   Current Medications: Current Facility-Administered Medications  Medication Dose Route Frequency Provider Last Rate Last Dose  . acetaminophen (TYLENOL) tablet 650 mg  650 mg Oral Q6H PRN Kristeen Mans, NP   650 mg at 02/15/13 1949  . alum & mag hydroxide-simeth (MAALOX/MYLANTA) 200-200-20 MG/5ML suspension 30 mL  30 mL Oral Q4H PRN Kristeen Mans, NP      . citalopram (CELEXA) tablet 20 mg  20 mg Oral Daily Adrik Khim   20 mg at 02/16/13 0732  . influenza vac split quadrivalent PF (FLUARIX) injection 0.5 mL  0.5 mL Intramuscular Tomorrow-1000 [name]      . magnesium hydroxide (MILK OF MAGNESIA) suspension 30 mL  30 mL Oral Daily PRN Kristeen Mans, NP      . pantoprazole (PROTONIX) EC tablet 40 mg  40 mg Oral Daily Kristeen Mans, NP   40 mg at 02/16/13 0733  . risperiDONE (RISPERDAL M-TABS) disintegrating tablet 1 mg  1 mg Oral QHS Verne Spurr, PA-C   1 mg at 02/15/13 2146  . traZODone (DESYREL) tablet 50 mg  50 mg Oral QHS PRN Kristeen Mans, NP        Lab Results:  No results found for this or any previous visit (from the past 48 hour(s)).  Physical Findings: AIMS: Facial and Oral Movements Muscles of Facial Expression: None, normal Lips and Perioral Area: None, normal Jaw: None, normal Tongue: None, normal,Extremity Movements Upper (arms, wrists, hands, fingers): None, normal Lower (legs, knees, ankles, toes): None, normal, Trunk Movements Neck, shoulders, hips: None, normal, Overall Severity Severity of abnormal movements (highest score from  questions above): None, normal Incapacitation due to abnormal movements: None, normal Patient's awareness of abnormal movements (rate only patient's report): No Awareness, Dental Status Current problems with teeth and/or dentures?: No Does patient usually wear dentures?: No  CIWA:    COWS:     Treatment Plan Summary: Daily contact with patient to assess and evaluate symptoms and progress in treatment Medication management.  Plan: Continue crisis management and stabilization.  Medication management: Reviewed with patient who stated no untoward effects. Increase Celexa to 20 mg for depressive/anxiety symptoms. Risperdal 1 mg daily at qhs for psychosis.  Encouraged patient to attend groups and participate in group counseling sessions and activities.  Discharge plan in progress.  Continue current treatment plan.  Address health issues: Vitals reviewed and stable.   Medical Decision Making Problem Points:  Established problem, stable/improving (1) and Review of psycho-social stressors (1) Data Points:  Review of medication regiment & side effects (2) Review of new medications or change in dosage (2)  I certify that inpatient services furnished can reasonably be expected to improve the patient's condition.   Thedore Mins, MD 02/16/2013, 11:01 AM

## 2013-02-16 NOTE — Progress Notes (Signed)
[facility] Adult Case Management Discharge Plan :  Will you be returning to the same living situation after discharge: Yes,  home At discharge, do you have transportation home?:Yes,  fiance Do you have the ability to pay for your medications:Yes,  mental health  Release of information consent forms completed and in the chart;  Patient's signature needed at discharge.  Patient to Follow up at: Follow-up Information   Follow up with [facility]. (Go to the walk-in clinic M-F between 8 and 9AM for your hospital follow up appointment)    Contact information:   [address]  [place]  [phone]      Patient denies SI/HI:   Yes,  yes    Safety Planning and Suicide Prevention discussed:  Yes,  yes  [name] 02/16/2013, 11:00 AM

## 2013-02-16 NOTE — Progress Notes (Signed)
D: Pt presents with flat affect and depressed mood. Pt forwards little information, guarded and minimizes his problems. Pt has poor insight at this time. Pt reports feeling better d/t the medication he is taking. Pt reports that he no longer feels depressed. Pt rates his depression 1/10 today. Pt denies SI/HI/AVH this morning. Pt compliant with taking meds and attending groups this morning. A: Medications administered as ordered per MD. Verbal support given. Pt encouraged to attend groups. 15 minute checks performed for safety. R: No complaints verbalized by pt at this time. No adverse reaction to meds verbalized by pt. Pt safety maintained.

## 2013-02-16 NOTE — [facility] Group Notes (Signed)
[facility] LCSW Group Therapy  02/16/2013 , 1:49 PM   Type of Therapy:  Group Therapy  Participation Level:  Active  Participation Quality:  Attentive  Affect:  Appropriate  Cognitive:  Alert  Insight:  Improving  Engagement in Therapy:  Engaged  Modes of Intervention:  Discussion, Exploration and Socialization  Summary of Progress/Problems: Today's group focused on the term Diagnosis.  Participants were asked to define the term, and then pronounce whether it is a negative, positive or neutral term.  Darsh enjoyed the conversation, and talked about his experience of mis-diagnosis with mental health.  He was philosophical, saying that it can be more difficult to diagnose mental illness than a medical issue.  He was engaged throughout, and joined in on the conversation about judgement, labels and stereotypes.  He also pointed out that folks with a mental health diagnosis sometimes use to what they perceive as their advantage, but it really propagates more labels, and gave the example of a family member.  [name] 02/16/2013 , 1:49 PM

## 2013-02-16 NOTE — Tx Team (Signed)
  Interdisciplinary Treatment Plan Update   Date Reviewed:  02/16/2013  Time Reviewed:  2:08 PM  Progress in Treatment:   Attending groups: Yes Participating in groups: Yes Taking medication as prescribed: Yes  Tolerating medication: Yes Family/Significant other contact made: Yes  Patient understands diagnosis: Yes  Discussing patient identified problems/goals with staff: Yes Medical problems stabilized or resolved: Yes Denies suicidal/homicidal ideation: Yes Patient has not harmed self or others: Yes  For review of initial/current patient goals, please see plan of care.  Estimated Length of Stay:  Likely d/c tomorrow  Reason for Continuation of Hospitalization:   New Problems/Goals identified:  N/A  Discharge Plan or Barriers:   return home, follow up outpt  Additional Comments:  Attendees:  Signature: Thedore Mins, MD 02/16/2013 2:08 PM   Signature: Richelle Ito, LCSW 02/16/2013 2:08 PM  Signature: Fransisca Kaufmann, NP 02/16/2013 2:08 PM  Signature: Joslyn Devon, RN 02/16/2013 2:08 PM  Signature: Liborio Nixon, RN 02/16/2013 2:08 PM  Signature:  02/16/2013 2:08 PM  Signature:   02/16/2013 2:08 PM  Signature:    Signature:    Signature:    Signature:    Signature:    Signature:      Scribe for Treatment Team:   Richelle Ito, LCSW  02/16/2013 2:08 PM

## 2013-02-16 NOTE — Progress Notes (Addendum)
D: Patient in the dayroom interacting with peers today.  Patient states he had a wonderful day.  Patient states his day was wonderful because he was able to laugh with the other patients.  Patient states he has learned to stay positive and to not to have negative thoughts.  Patient states he is looking forward to discharge.  Patient states he is going to spend time with his family for the holidays.  Patient denies SI/HI and denies AVH.  A: Staff to monitor Q 15 mins for safety.  Encouragement and support offered.  Scheduled medications administered per orders. R: Patient remains safe on the unit.  Patient attended group tonight.  Patient visible on the unit and interacting with peers.  Patient taking administered medications.

## 2013-02-16 NOTE — [facility] Group Notes (Signed)
Adult Psychoeducational Group Note  Date:  02/16/2013 Time:  9:10 PM  Group Topic/Focus:  Wrap-Up Group:   The focus of this group is to help patients review their daily goal of treatment and discuss progress on daily workbooks.  Participation Level:  Minimal  Participation Quality:  Appropriate  Affect:  Flat  Cognitive:  Appropriate  Insight: Appropriate  Engagement in Group:  Limited  Modes of Intervention:  Discussion  Additional Comments:  [name] stated his day was real good.  He was able to go to the gym; play basketball and volleyball.  He also stated he played cards, ate good food and was around positive people.  [name] A 02/16/2013, 9:10 PM

## 2013-02-16 NOTE — Progress Notes (Signed)
Pt verbalized to Clinical research associate that he haven't coped with his losses until now. Pt stated  he enjoy attending groups because he is able to verbalize his feelings. Pt stated  he don't have anyone to express his feelings to other than his girlfriend. Pt is now able to verbalize his feelings. Pt has insight for treatment and verbalized understanding of needing to take his antidepressant.

## 2013-02-17 MED ORDER — RISPERIDONE 1 MG PO TBDP: 1.0000 mg | ORAL_TABLET | Freq: Every day | ORAL | Status: DC

## 2013-02-17 MED ORDER — CITALOPRAM HYDROBROMIDE 20 MG PO TABS: 20.0000 mg | ORAL_TABLET | Freq: Every day | ORAL | Status: DC

## 2013-02-17 MED ORDER — TRAZODONE HCL 50 MG PO TABS: 50.0000 mg | ORAL_TABLET | Freq: Every evening | ORAL | Status: DC | PRN

## 2013-02-17 MED ORDER — OMEPRAZOLE 20 MG PO CPDR: 20.0000 mg | DELAYED_RELEASE_CAPSULE | Freq: Two times a day (BID) | ORAL | Status: DC

## 2013-02-17 NOTE — Progress Notes (Signed)
Recreation Therapy Notes  Date: 11.26.2014 Time: 9:30pm Location: [address] Dayroom   Group Topic: Coping Skills  Goal Area(s) Addresses:  Patient will complete grateful mandala. Patient will identify benefit of identifying things he/she is grateful for.    Behavioral Response: Appropriate, Engaged.   Intervention: Mandala  Activity: I am Grateful Mandala. Patient was asked to identify things they are grateful for to fit in various categories such as Happiness, Laughter; Mind, Body, Spirit; Knowledge, Education  Education: Pharmacologist, Discharge Planning  Education Outcome: Needs additional education.    Clinical Observations/Feedback: Patient participated in group activity, patient colored worksheet, but was unable to identify what he is grateful for stating he needed additional time to think about what he would write down. LRT encouraged patient to continue to work on worksheet after group session and during admission.   Marykay Lex Tamir Wallman, LRT/CTRS  Jearl Klinefelter 02/17/2013 12:59 PM

## 2013-02-17 NOTE — Discharge Summary (Signed)
Physician Discharge Summary Note  Patient:  [name] is an 32 y.o., male MRN:  [id number] DOB:  February 21, 1981 Patient phone:  [phone] (home)  Patient address:   [address] [place] [address],   Date of Admission:  02/13/2013 Date of Discharge: 02/17/13  Reason for Admission:  Depression with SI, Psychosis  Discharge Diagnoses: Principal Problem:   Mood disorder, drug-induced Active Problems:   Marijuana dependence  Review of Systems  Constitutional: Negative.   HENT: Negative.   Eyes: Negative.   Respiratory: Negative.   Cardiovascular: Negative.   Gastrointestinal: Negative.   Genitourinary: Negative.   Musculoskeletal: Negative.   Skin: Negative.   Neurological: Negative.   Endo/Heme/Allergies: Negative.   Psychiatric/Behavioral: Positive for substance abuse. Negative for depression, suicidal ideas, hallucinations and memory loss. The patient is nervous/anxious. The patient does not have insomnia.     DSM5:  Axis Diagnosis:  AXIS I: Mood disorder, drug-induced  Marijuana use disorder  AXIS II: Deferred  AXIS III:  Past Medical History   Diagnosis  Date   .  Migraine    .  Ulcer    .  Mental disorder     AXIS IV: economic problems, other psychosocial or environmental problems and problems related to social environment  AXIS V: 61-70 mild symptoms   Level of Care:  OP  Hospital Course:  [name] is a 32 year old SAA male who presents to the [facility] due to his girl friend's request. The patient states that he has suffered many family losses over the past 3 years totaling 10 in all. 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 He has 5 children, does not read, and is overwhelmed with depression. He notes that over the past 1-3 months, he feels depressed, cries daily, just doesn't feel like living anymore, fatigued all the time, doesn't sleep, feels hopeless, and feels helpless. He does report talking to  his mother who passed away a year ago, and seeing her in the room with him. He says he talks to himself all the time.         [name] was admitted to the adult unit. He was evaluated and his symptoms were identified. He was not taking any psychiatric medications prior to his admission.  Medication management was discussed and initiated. He was started on Celexa for depression/anxiety and Risperdal M-tab for psychosis. He was oriented to the unit and encouraged to participate in unit programming. Patient minimized his reasons for admission stating "I was just not sleeping from being busy taking care of all my children. You would get tired too." Patient was not forthcoming about his substance abuse as his urine drug screen was positive for multiple substances including amphetamines, opiates, and marijuana. Patient did not have any active medical problems that required treatment during his admission.         The patient was evaluated each day by a clinical provider to ascertain the patient's response to treatment. Improvement was noted by the patient's report of decreasing symptoms, improved sleep and appetite, affect, medication tolerance, behavior, and participation in unit programming.  [name] was asked each day to complete a self inventory noting mood, sleep, mental status, pain, new symptoms, anxiety and concerns.         He responded well to medication and being in a therapeutic and supportive environment. Positive and appropriate behavior was noted and the patient was motivated for  recovery.  [name] worked closely with the treatment team and case manager to develop a discharge plan with appropriate goals. Coping skills, problem solving as well as relaxation therapies were also part of the unit programming.         By the day of discharge [name] was in much improved condition than upon admission.  Symptoms were reported as significantly decreased or resolved completely.  The patient denied  SI/HI and voiced no AVH. He was motivated to continue taking medication with a goal of continued improvement in mental health.          [name] was discharged home with a plan to follow up as noted below.  Consults:  None  Significant Diagnostic Studies:  labs: Admisison labs reviewed   Discharge Vitals:   Blood pressure 138/91, pulse 92, temperature 98.2 F (36.8 C), temperature source Oral, resp. rate 20, height 5\' 8"  (1.727 m), weight 62.143 kg (137 lb). Body mass index is 20.84 kg/(m^2). Lab Results:   No results found for this or any previous visit (from the past 72 hour(s)).  Physical Findings: AIMS: Facial and Oral Movements Muscles of Facial Expression: None, normal Lips and Perioral Area: None, normal Jaw: None, normal Tongue: None, normal,Extremity Movements Upper (arms, wrists, hands, fingers): None, normal Lower (legs, knees, ankles, toes): None, normal, Trunk Movements Neck, shoulders, hips: None, normal, Overall Severity Severity of abnormal movements (highest score from questions above): None, normal Incapacitation due to abnormal movements: None, normal Patient's awareness of abnormal movements (rate only patient's report): No Awareness, Dental Status Current problems with teeth and/or dentures?: No Does patient usually wear dentures?: No  CIWA:    COWS:     Psychiatric Specialty Exam: See Psychiatric Specialty Exam and Suicide Risk Assessment completed by Attending Physician prior to discharge.  Discharge destination:  Home  Is patient on multiple antipsychotic therapies at discharge:  No   Has Patient had three or more failed trials of antipsychotic monotherapy by history:  No  Recommended Plan for Multiple Antipsychotic Therapies: NA     Medication List    STOP taking these medications       GOODY HEADACHE PO      TAKE these medications     Indication   acetaminophen 500 MG tablet  Commonly known as:  TYLENOL  Take 500 mg by mouth every 6  (six) hours as needed for pain (pain).      citalopram 20 MG tablet  Commonly known as:  CELEXA  Take 1 tablet (20 mg total) by mouth daily. For depression/anxiety   Indication:  Depression     omeprazole 20 MG capsule  Commonly known as:  PRILOSEC  Take 1 capsule (20 mg total) by mouth 2 (two) times daily before a meal.   Indication:  Gastroesophageal Reflux Disease with Current Symptoms     risperiDONE 1 MG disintegrating tablet  Commonly known as:  RISPERDAL M-TABS  Take 1 tablet (1 mg total) by mouth at bedtime. For mood control.   Indication:  Mood stability     traZODone 50 MG tablet  Commonly known as:  DESYREL  Take 1 tablet (50 mg total) by mouth at bedtime as needed for sleep.   Indication:  Trouble Sleeping           Follow-up Information   Follow up with [facility]. (Go to the walk-in clinic M-F between 8 and 9AM for your hospital follow up appointment)    Contact information:  [address]  [employer] [place]  [336] [phone]      Follow-up recommendations:   Activity: as tolerated  Diet: healthy  Tests: routine  Other: patient to keep his after care appointment   Comments:   Take all your medications as prescribed by your mental healthcare provider.  Report any adverse effects and or reactions from your medicines to your outpatient provider promptly.  Patient is instructed and cautioned to not engage in alcohol and or illegal drug use while on prescription medicines.  In the event of worsening symptoms, patient is instructed to call the crisis hotline, 911 and or go to the nearest ED for appropriate evaluation and treatment of symptoms.  Follow-up with your primary care provider for your other medical issues, concerns and or health care needs.   Total Discharge Time:  Greater than 30 minutes.  SignedFransisca Kaufmann NP-C 02/17/2013, 9:47 AM

## 2013-02-17 NOTE — Progress Notes (Signed)
Discharge note: Pt received both written and verbal discharge instructions. Pt agreed to f/u appt and med regimen. Pt received all belongings from room and locker. Pt received sample meds and prescriptions. Pt denies SI and depression at time of discharge. Pt safely left [facility].

## 2013-02-17 NOTE — [facility] Suicide Risk Assessment (Signed)
Suicide Risk Assessment  Discharge Assessment     Demographic Factors:  Male, Low socioeconomic status and Unemployed  Mental Status Per Nursing Assessment::   On Admission:     Current Mental Status by Physician: patient denies suicidal ideation, intent or plan  Loss Factors: Financial problems/change in socioeconomic status  Historical Factors: Impulsivity  Risk Reduction Factors:   Sense of responsibility to family, Living with another person, especially a relative and Positive social support  Continued Clinical Symptoms:  Alcohol/Substance Abuse/Dependencies  Cognitive Features That Contribute To Risk:  Polarized thinking    Suicide Risk:  Minimal: No identifiable suicidal ideation.  Patients presenting with no risk factors but with morbid ruminations; may be classified as minimal risk based on the severity of the depressive symptoms  Discharge Diagnoses:   AXIS I:  Mood disorder, drug-induced              Marijuana use disorder  AXIS II:  Deferred AXIS III:   Past Medical History  Diagnosis Date  . Migraine   . Ulcer   . Mental disorder    AXIS IV:  economic problems, other psychosocial or environmental problems and problems related to social environment AXIS V:  61-70 mild symptoms  Plan Of Care/Follow-up recommendations:  Activity:  as tolerated Diet:  healthy Tests:  routine Other:  patient to keep his after care appointment  Is patient on multiple antipsychotic therapies at discharge:  No   Has Patient had three or more failed trials of antipsychotic monotherapy by history:  No  Recommended Plan for Multiple Antipsychotic Therapies: NA  Annette Liotta,MD 02/17/2013, 10:03 AM

## 2013-02-18 NOTE — Discharge Summary (Signed)
Seen and agreed. Akia Desroches, MD 

## 2013-02-20 NOTE — ED Provider Notes (Signed)
Medical screening examination/treatment/procedure(s) were performed by non-physician practitioner and as supervising physician I was immediately available for consultation/collaboration.    Nelia Shi, MD 02/20/13 [phone]

## 2013-02-22 NOTE — Progress Notes (Signed)
Patient Discharge Instructions:  After Visit Summary (AVS):   Faxed to:  02/22/13 Discharge Summary Note:   Faxed to:  02/22/13 Psychiatric Admission Assessment Note:   Faxed to:  02/22/13 Suicide Risk Assessment - Discharge Assessment:   Faxed to:  02/22/13 Faxed/Sent to the [facility] Care provider:  02/22/13 Faxed to [facility] @ [phone] Faxed to [facility] @ [phone]  Jerelene Redden, 02/22/2013, 3:43 PM

## 2013-06-19 ENCOUNTER — Encounter (HOSPITAL_COMMUNITY): Payer: Self-pay | Admitting: Emergency Medicine

## 2013-06-19 ENCOUNTER — Emergency Department (HOSPITAL_COMMUNITY)
Admission: EM | Admit: 2013-06-19 | Discharge: 2013-06-19 | Disposition: A | Discharge status: Home / Self Care | Payer: Self-pay | Attending: Emergency Medicine | Admitting: Emergency Medicine

## 2013-06-19 DIAGNOSIS — Z872 Personal history of diseases of the skin and subcutaneous tissue: Secondary | ICD-10-CM | POA: Insufficient documentation

## 2013-06-19 DIAGNOSIS — Z79899 Other long term (current) drug therapy: Secondary | ICD-10-CM | POA: Insufficient documentation

## 2013-06-19 DIAGNOSIS — F17200 Nicotine dependence, unspecified, uncomplicated: Secondary | ICD-10-CM | POA: Insufficient documentation

## 2013-06-19 DIAGNOSIS — K029 Dental caries, unspecified: Secondary | ICD-10-CM | POA: Insufficient documentation

## 2013-06-19 DIAGNOSIS — Z8679 Personal history of other diseases of the circulatory system: Secondary | ICD-10-CM | POA: Insufficient documentation

## 2013-06-19 DIAGNOSIS — Z8659 Personal history of other mental and behavioral disorders: Secondary | ICD-10-CM | POA: Insufficient documentation

## 2013-06-19 MED ORDER — IBUPROFEN 800 MG PO TABS
800.0000 mg | ORAL_TABLET | Freq: Once | ORAL | Status: AC
  Administered 2013-06-19: 800 mg via ORAL
  Filled 2013-06-19: qty 1

## 2013-06-19 MED ORDER — PENICILLIN V POTASSIUM 500 MG PO TABS
500.0000 mg | ORAL_TABLET | Freq: Four times a day (QID) | ORAL | Status: DC
Start: 2013-06-19 — End: 2013-12-30

## 2013-06-19 MED ORDER — PENICILLIN V POTASSIUM 500 MG PO TABS
500.0000 mg | ORAL_TABLET | Freq: Once | ORAL | Status: AC
  Administered 2013-06-19: 500 mg via ORAL
  Filled 2013-06-19: qty 1

## 2013-06-19 MED ORDER — IBUPROFEN 800 MG PO TABS: 800.0000 mg | ORAL_TABLET | Freq: Three times a day (TID) | ORAL | Status: DC

## 2013-06-19 NOTE — ED Notes (Signed)
Patient is alert and oriented x3.  He states that he is having dental pain that started on Monday after eating an apple. He Currently rates his pain 10 of 10.

## 2013-06-19 NOTE — Discharge Instructions (Signed)
Dental Caries Call Dr.Farless's office in 2 days to schedule next available appointment. Take Tylenol as directed or the prescribed medicine for pain. It is okay to use Orajel as well. Make sure that you take your omeprazole with the ibuprofen prescribed and take ibuprofen with food.   GetYour blood pressure recheck within the next 3 weeks. Today's was mildly elevated at 147/90 Dental caries is tooth decay. This decay can cause a hole in teeth (cavity) that can get bigger and deeper over time. HOME CARE  Brush and floss your teeth. Do this at least two times a day.  Use a fluoride toothpaste.  Use a mouth rinse if told by your dentist or doctor.  Eat less sugary and starchy foods. Drink less sugary drinks.  Avoid snacking often on sugary and starchy foods. Avoid sipping often on sugary drinks.  Keep regular checkups and cleanings with your dentist.  Use fluoride supplements if told by your dentist or doctor.  Allow fluoride to be applied to teeth if told by your dentist or doctor. MAKE SURE YOU:  Understand these instructions.  Will watch your condition.  Will get help right away if you are not doing well or get worse. Document Released: 12/19/2007 Document Revised: 11/11/2012 Document Reviewed: 03/13/2012 [facility] Patient Information 2014 [employer], [place].

## 2013-06-19 NOTE — ED Provider Notes (Signed)
CSN: [id number]     Arrival date & time 06/19/13  [id number] History   First MD Initiated Contact with Patient 06/19/13 0719     Chief Complaint  Patient presents with  . Dental Pain     (Consider location/radiation/quality/duration/timing/severity/associated sxs/prior Treatment) HPI Complaint of toothache for one week. States "I have a hole in my tooth" pain is a right lower molar. He is treated himself with Orajel, without relief. Nothing makes symptoms better or worse. No fever. No other associated symptoms. Past Medical History  Diagnosis Date  . Migraine   . Ulcer   . Mental disorder    suicide attempt. Psychosis. Polysubstance abuse History reviewed. No pertinent past surgical history. History reviewed. No pertinent family history. History  Substance Use Topics  . Smoking status: Current Every Day Smoker -- 0.50 packs/day  . Smokeless tobacco: Never Used  . Alcohol Use: No    Review of Systems  Constitutional: Negative.   HENT: Positive for dental problem.   Psychiatric/Behavioral: Negative.       Allergies  Review of patient's allergies indicates no known allergies.  Home Medications   Current Outpatient Rx  Name  Route  Sig  Dispense  Refill  . acetaminophen (TYLENOL) 500 MG tablet   Oral   Take 500 mg by mouth every 6 (six) hours as needed for pain (pain).         . citalopram (CELEXA) 20 MG tablet   Oral   Take 1 tablet (20 mg total) by mouth daily. For depression/anxiety   30 tablet   0   . omeprazole (PRILOSEC) 20 MG capsule   Oral   Take 1 capsule (20 mg total) by mouth 2 (two) times daily before a meal.   30 capsule   0   . risperiDONE (RISPERDAL M-TABS) 1 MG disintegrating tablet   Oral   Take 1 tablet (1 mg total) by mouth at bedtime. For mood control.   30 tablet   0   . traZODone (DESYREL) 50 MG tablet   Oral   Take 1 tablet (50 mg total) by mouth at bedtime as needed for sleep.   30 tablet   0    BP 150/92  Pulse 78  Temp(Src)  98.1 F (36.7 C) (Oral)  Resp 18  SpO2 97% Physical Exam  Nursing note and vitals reviewed. Constitutional: He appears well-developed and well-nourished. No distress.  HENT:  Head: Normocephalic and atraumatic.  Right Ear: External ear normal.  Left Ear: External ear normal.  Mouth/Throat: Oropharynx is clear and moist. No oropharyngeal exudate.  Right lower second molar with dental carry. Not loose. Tender to touch. No surrounding fluctuance or swelling or redness of the gingiva. No trismus.  Eyes: Conjunctivae are normal. Pupils are equal, round, and reactive to light.  Neck: Neck supple. No tracheal deviation present. No thyromegaly present.  Cardiovascular: Normal rate.   Pulmonary/Chest: Effort normal.  Abdominal: Bowel sounds are normal. He exhibits no distension.  Musculoskeletal: Normal range of motion. He exhibits no edema and no tenderness.  Lymphadenopathy:    He has no cervical adenopathy.  Neurological: He is alert. Coordination normal.  Skin: Skin is warm and dry. No rash noted.  Psychiatric: He has a normal mood and affect.    ED Course  Procedures (including critical care time) Labs Review Labs Reviewed - No data to display Imaging Review No results found.   EKG Interpretation None      MDM   Final diagnoses:  None  dental referral. Patient has history of polysubstance abuse. Will not be prescribed opiate Plan prescription ibuprofen, penicillin. Blood pressure recheck 3 weeks. Diagnosis#1 dental carry #2 elevated blood pressure   Doug Sou, MD 06/19/13 [phone]

## 2013-06-19 NOTE — ED Notes (Signed)
Pt ambulatory out of dept in NAD. Pt mad that MD would not prescribe anything stronger than ibuprofen. Sts last time he came to ED for dental pain, he was given percocet. Attempted to explain to pt that ibuprofen and abx would help treat the inflammation causing the pain, and that narcotics would not be effective at treating the actual cause. Pt became frustrated, telling RN to hurry up. Pt d/c with Rx x2 and rest of d/c paperwork.

## 2013-12-30 ENCOUNTER — Emergency Department (HOSPITAL_COMMUNITY)
Admission: EM | Admit: 2013-12-30 | Discharge: 2013-12-31 | Disposition: A | Discharge status: Home / Self Care | Payer: Self-pay | Attending: Emergency Medicine | Admitting: Emergency Medicine

## 2013-12-30 ENCOUNTER — Emergency Department (HOSPITAL_COMMUNITY): Payer: Self-pay

## 2013-12-30 ENCOUNTER — Encounter (HOSPITAL_COMMUNITY): Payer: Self-pay | Admitting: Emergency Medicine

## 2013-12-30 DIAGNOSIS — Z8659 Personal history of other mental and behavioral disorders: Secondary | ICD-10-CM | POA: Insufficient documentation

## 2013-12-30 DIAGNOSIS — M545 Low back pain, unspecified: Secondary | ICD-10-CM

## 2013-12-30 DIAGNOSIS — Y9389 Activity, other specified: Secondary | ICD-10-CM | POA: Insufficient documentation

## 2013-12-30 DIAGNOSIS — W108XXA Fall (on) (from) other stairs and steps, initial encounter: Secondary | ICD-10-CM | POA: Insufficient documentation

## 2013-12-30 DIAGNOSIS — Y9289 Other specified places as the place of occurrence of the external cause: Secondary | ICD-10-CM | POA: Insufficient documentation

## 2013-12-30 DIAGNOSIS — M546 Pain in thoracic spine: Secondary | ICD-10-CM

## 2013-12-30 DIAGNOSIS — Z872 Personal history of diseases of the skin and subcutaneous tissue: Secondary | ICD-10-CM | POA: Insufficient documentation

## 2013-12-30 DIAGNOSIS — S3992XA Unspecified injury of lower back, initial encounter: Secondary | ICD-10-CM | POA: Insufficient documentation

## 2013-12-30 DIAGNOSIS — G43909 Migraine, unspecified, not intractable, without status migrainosus: Secondary | ICD-10-CM | POA: Insufficient documentation

## 2013-12-30 DIAGNOSIS — W010XXA Fall on same level from slipping, tripping and stumbling without subsequent striking against object, initial encounter: Secondary | ICD-10-CM | POA: Insufficient documentation

## 2013-12-30 DIAGNOSIS — S24109A Unspecified injury at unspecified level of thoracic spinal cord, initial encounter: Secondary | ICD-10-CM | POA: Insufficient documentation

## 2013-12-30 DIAGNOSIS — Z720 Tobacco use: Secondary | ICD-10-CM | POA: Insufficient documentation

## 2013-12-30 DIAGNOSIS — Z79899 Other long term (current) drug therapy: Secondary | ICD-10-CM | POA: Insufficient documentation

## 2013-12-30 MED ORDER — IBUPROFEN 800 MG PO TABS
800.0000 mg | ORAL_TABLET | Freq: Once | ORAL | Status: AC
  Administered 2013-12-30: 800 mg via ORAL
  Filled 2013-12-30: qty 1

## 2013-12-30 MED ORDER — OXYCODONE-ACETAMINOPHEN 5-325 MG PO TABS
2.0000 | ORAL_TABLET | Freq: Once | ORAL | Status: AC
  Administered 2013-12-30: 2 via ORAL
  Filled 2013-12-30: qty 2

## 2013-12-30 NOTE — ED Notes (Signed)
Pt presents with c/o migraine headache and back pain. Pt reports his back is hurting after a fall he had about a week ago. Pt reports he has had migraines on and off all week but the headache became worse today. Pt reports light sensitivity at this time, hx of migraines.

## 2013-12-30 NOTE — ED Provider Notes (Signed)
TIME SEEN: 11:25 PM  CHIEF COMPLAINT: Migraine, back pain  HPI: Patient is a 33 y.o. M with history of migraine headaches who presents emergency department with a migraine headache for the past several days that has been intermittent. He describes as diffuse, throbbing without radiation. Worse with lights and sounds. He has had associated intermittent nausea. No head injury, numbness, tingling or focal weakness. No fevers, neck pain or neck stiffness.   Patient is also complaining of diffuse thoracic and lumbar back pain. He reports that approximately one week ago he slipped on the stairs outside his home and fell down approximately 10 stairs. He states he did not hit his head but has had back pain since. No bowel or bladder incontinence. He did not go to the doctor to be evaluated after this fall.  ROS: See HPI Constitutional: no fever  Eyes: no drainage  ENT: no runny nose   Cardiovascular:  no chest pain  Resp: no SOB  GI: no vomiting GU: no dysuria Integumentary: no rash  Allergy: no hives  Musculoskeletal: no leg swelling  Neurological: no slurred speech ROS otherwise negative  PAST MEDICAL HISTORY/PAST SURGICAL HISTORY:  Past Medical History  Diagnosis Date  . Migraine   . Ulcer   . Mental disorder     MEDICATIONS:  Prior to Admission medications   Medication Sig Start Date End Date Taking? Authorizing Provider  citalopram (CELEXA) 20 MG tablet Take 1 tablet (20 mg total) by mouth daily. For depression/anxiety 02/17/13  Yes Fransisca KaufmannLaura Davis, NP  ibuprofen (ADVIL,MOTRIN) 200 MG tablet Take 800 mg by mouth every 6 (six) hours as needed for moderate pain (pain).    Yes Historical Provider, MD  omeprazole (PRILOSEC) 20 MG capsule Take 1 capsule (20 mg total) by mouth 2 (two) times daily before a meal. 02/17/13  Yes Fransisca KaufmannLaura Davis, NP  traZODone (DESYREL) 50 MG tablet Take 50 mg by mouth at bedtime as needed for sleep (insomnia).   Yes Historical Provider, MD    ALLERGIES:  No Known  Allergies  SOCIAL HISTORY:  History  Substance Use Topics  . Smoking status: Current Every Day Smoker -- 0.50 packs/day  . Smokeless tobacco: Never Used  . Alcohol Use: No    FAMILY HISTORY: No family history on file.  EXAM: BP 115/83  Pulse 71  Temp(Src) 97.8 F (36.6 C) (Oral)  Resp 16  SpO2 98% CONSTITUTIONAL: Alert and oriented and responds appropriately to questions. Well-appearing; well-nourished; GCS 15 HEAD: Normocephalic; atraumatic EYES: Conjunctivae clear, PERRL, EOMI ENT: normal nose; no rhinorrhea; moist mucous membranes; pharynx without lesions noted; no dental injury; no septal hematoma NECK: Supple, no meningismus, no LAD; no midline spinal tenderness, step-off or deformity CARD: RRR; S1 and S2 appreciated; no murmurs, no clicks, no rubs, no gallops RESP: Normal chest excursion without splinting or tachypnea; breath sounds clear and equal bilaterally; no wheezes, no rhonchi, no rales; chest wall stable, nontender to palpation ABD/GI: Normal bowel sounds; non-distended; soft, non-tender, no rebound, no guarding PELVIS:  stable, nontender to palpation BACK:  The back appears normal and is diffuse thoracic and lumbar tenderness abdomen midline without step-off or deformity in over the paraspinal musculature without lesions noted EXT: Normal ROM in all joints; non-tender to palpation; no edema; normal capillary refill; no cyanosis    SKIN: Normal color for age and race; warm NEURO: Moves all extremities equally, sensation to light touch intact diffusely, cranial nerves II through XII intact, normal gait PSYCH: The patient's mood and manner are  appropriate. Grooming and personal hygiene are appropriate.  MEDICAL DECISION MAKING: Patient here with migraine headache and a mechanical fall with back pain. No neurologic deficits. No infectious symptoms. Will treat with ibuprofen, Percocet and obtain x-rays of his thoracic and lumbar spine. He states his headache feels exactly  like his prior migraines. Denies hitting his head during his recent fall. Not on anticoagulation.  ED PROGRESS: Patient reports some improvement with Percocet and ibuprofen. X-ray show no acute injury. We'll treat with migraine cocktail including Toradol, Reglan, Benadryl, IV fluids and Decadron.   1:20 AM  Pt reports his headache and back pain are completely gone after migraine cocktail. We'll discharge with prescriptions for Vicodin and ibuprofen to use as needed. Will give outpatient resources. Discussed return precautions and supportive care instructions. Patient and significant other at bedside verbalize understanding and are comfortable with plan.  Layla Maw Ward, DO 12/31/13 [phone]

## 2013-12-31 MED ORDER — SODIUM CHLORIDE 0.9 % IV BOLUS (SEPSIS)
1000.0000 mL | Freq: Once | INTRAVENOUS | Status: AC
  Administered 2013-12-31: 1000 mL via INTRAVENOUS

## 2013-12-31 MED ORDER — DIPHENHYDRAMINE HCL 50 MG/ML IJ SOLN
25.0000 mg | Freq: Once | INTRAMUSCULAR | Status: AC
  Administered 2013-12-31: 25 mg via INTRAVENOUS
  Filled 2013-12-31: qty 1

## 2013-12-31 MED ORDER — DEXAMETHASONE SODIUM PHOSPHATE 10 MG/ML IJ SOLN
10.0000 mg | Freq: Once | INTRAMUSCULAR | Status: AC
  Administered 2013-12-31: 10 mg via INTRAVENOUS
  Filled 2013-12-31: qty 1

## 2013-12-31 MED ORDER — METOCLOPRAMIDE HCL 5 MG/ML IJ SOLN
10.0000 mg | Freq: Once | INTRAMUSCULAR | Status: AC
  Administered 2013-12-31: 10 mg via INTRAVENOUS
  Filled 2013-12-31: qty 2

## 2013-12-31 MED ORDER — IBUPROFEN 800 MG PO TABS: 800.0000 mg | ORAL_TABLET | Freq: Three times a day (TID) | ORAL | Status: DC | PRN

## 2013-12-31 MED ORDER — HYDROCODONE-ACETAMINOPHEN 5-325 MG PO TABS: 1.0000 | ORAL_TABLET | ORAL | Status: DC | PRN

## 2013-12-31 MED ORDER — KETOROLAC TROMETHAMINE 30 MG/ML IJ SOLN
30.0000 mg | Freq: Once | INTRAMUSCULAR | Status: AC
  Administered 2013-12-31: 30 mg via INTRAVENOUS
  Filled 2013-12-31: qty 1

## 2013-12-31 NOTE — Discharge Instructions (Signed)
Back Pain, Adult °Low back pain is very common. About 1 in 5 people have back pain. The cause of low back pain is rarely dangerous. The pain often gets better over time. About half of people with a sudden onset of back pain feel better in just 2 weeks. About 8 in 10 people feel better by 6 weeks.  °CAUSES °Some common causes of back pain include: °· Strain of the muscles or ligaments supporting the spine. °· Wear and tear (degeneration) of the spinal discs. °· Arthritis. °· Direct injury to the back. °DIAGNOSIS °Most of the time, the direct cause of low back pain is not known. However, back pain can be treated effectively even when the exact cause of the pain is unknown. Answering your caregiver's questions about your overall health and symptoms is one of the most accurate ways to make sure the cause of your pain is not dangerous. If your caregiver needs more information, he or she may order lab work or imaging tests (X-rays or MRIs). However, even if imaging tests show changes in your back, this usually does not require surgery. °HOME CARE INSTRUCTIONS °For many people, back pain returns. Since low back pain is rarely dangerous, it is often a condition that people can learn to manage on their own.  °· Remain active. It is stressful on the back to sit or stand in one place. Do not sit, drive, or stand in one place for more than 30 minutes at a time. Take short walks on level surfaces as soon as pain allows. Try to increase the length of time you walk each day. °· Do not stay in bed. Resting more than 1 or 2 days can delay your recovery. °· Do not avoid exercise or work. Your body is made to move. It is not dangerous to be active, even though your back may hurt. Your back will likely heal faster if you return to being active before your pain is gone. °· Pay attention to your body when you  bend and lift. Many people have less discomfort when lifting if they bend their knees, keep the load close to their bodies, and  avoid twisting. Often, the most comfortable positions are those that put less stress on your recovering back. °· Find a comfortable position to sleep. Use a firm mattress and lie on your side with your knees slightly bent. If you lie on your back, put a pillow under your knees. °· Only take over-the-counter or prescription medicines as directed by your caregiver. Over-the-counter medicines to reduce pain and inflammation are often the most helpful. Your caregiver may prescribe muscle relaxant drugs. These medicines help dull your pain so you can more quickly return to your normal activities and healthy exercise. °· Put ice on the injured area. °¨ Put ice in a plastic bag. °¨ Place a towel between your skin and the bag. °¨ Leave the ice on for 15-20 minutes, 03-04 times a day for the first 2 to 3 days. After that, ice and heat may be alternated to reduce pain and spasms. °· Ask your caregiver about trying back exercises and gentle massage. This may be of some benefit. °· Avoid feeling anxious or stressed. Stress increases muscle tension and can worsen back pain. It is important to recognize when you are anxious or stressed and learn ways to manage it. Exercise is a great option. °SEEK MEDICAL CARE IF: °· You have pain that is not relieved with rest or medicine. °· You have pain that does not improve in 1 week. °· You have new symptoms. °· You are generally not feeling well. °SEEK   IMMEDIATE MEDICAL CARE IF:  °· You have pain that radiates from your back into your legs. °· You develop new bowel or bladder control problems. °· You have unusual weakness or numbness in your arms or legs. °· You develop nausea or vomiting. °· You develop abdominal pain. °· You feel faint. °Document Released: 03/11/2005 Document Revised: 09/10/2011 Document Reviewed: 07/13/2013 °ExitCare® Patient Information ©2015 ExitCare, LLC. This information is not intended to replace advice given to you by your health care provider. Make sure you  discuss any questions you have with your health care provider. °Migraine Headache °A migraine headache is an intense, throbbing pain on one or both sides of your head. A migraine can last for 30 minutes to several hours. °CAUSES  °The exact cause of a migraine headache is not always known. However, a migraine may be caused when nerves in the brain become irritated and release chemicals that cause inflammation. This causes pain. °Certain things may also trigger migraines, such as: °· Alcohol. °· Smoking. °· Stress. °· Menstruation. °· Aged cheeses. °· Foods or drinks that contain nitrates, glutamate, aspartame, or tyramine. °· Lack of sleep. °· Chocolate. °· Caffeine. °· Hunger. °· Physical exertion. °· Fatigue. °· Medicines used to treat chest pain (nitroglycerine), birth control pills, estrogen, and some blood pressure medicines. °SIGNS AND SYMPTOMS °· Pain on one or both sides of your head. °· Pulsating or throbbing pain. °· Severe pain that prevents daily activities. °· Pain that is aggravated by any physical activity. °· Nausea, vomiting, or both. °· Dizziness. °· Pain with exposure to bright lights, loud noises, or activity. °· General sensitivity to bright lights, loud noises, or smells. °Before you get a migraine, you may get warning signs that a migraine is coming (aura). An aura may include: °· Seeing flashing lights. °· Seeing bright spots, halos, or zigzag lines. °· Having tunnel vision or blurred vision. °· Having feelings of numbness or tingling. °· Having trouble talking. °· Having muscle weakness. °DIAGNOSIS  °A migraine headache is often diagnosed based on: °· Symptoms. °· Physical exam. °· A CT scan or MRI of your head. These imaging tests cannot diagnose migraines, but they can help rule out other causes of headaches. °TREATMENT °Medicines may be given for pain and nausea. Medicines can also be given to help prevent recurrent migraines.  °HOME CARE INSTRUCTIONS °· Only take over-the-counter or  prescription medicines for pain or discomfort as directed by your health care provider. The use of long-term narcotics is not recommended. °· Lie down in a dark, quiet room when you have a migraine. °· Keep a journal to find out what may trigger your migraine headaches. For example, write down: °¨ What you eat and drink. °¨ How much sleep you get. °¨ Any change to your diet or medicines. °· Limit alcohol consumption. °· Quit smoking if you smoke. °· Get 7-9 hours of sleep, or as recommended by your health care provider. °· Limit stress. °· Keep lights dim if bright lights bother you and make your migraines worse. °SEEK IMMEDIATE MEDICAL CARE IF:  °· Your migraine becomes severe. °· You have a fever. °· You have a stiff neck. °· You have vision loss. °· You have muscular weakness or loss of muscle control. °· You start losing your balance or have trouble walking. °· You feel faint or pass out. °· You have severe symptoms that are different from your first symptoms. °MAKE SURE YOU:  °· Understand these instructions. °· Will watch your condition. °·   Will get help right away if you are not doing well or get worse. Document Released: 03/11/2005 Document Revised: 07/26/2013 Document Reviewed: 11/16/2012 [facility] Patient Information 2015 [place], [place]. This information is not intended to replace advice given to you by your health care provider. Make sure you discuss any questions you have with your health care provider.     Emergency Department Resource Guide 1) Find a Doctor and Pay Out of Pocket Although you won't have to find out who is covered by your insurance plan, it is a good idea to ask around and get recommendations. You will then need to call the office and see if the doctor you have chosen will accept you as a new patient and what types of options they offer for patients who are self-pay. Some doctors offer discounts or will set up payment plans for their patients who do not have insurance, but you  will need to ask so you aren't surprised when you get to your appointment.  2) Contact Your Local Health Department Not all health departments have doctors that can see patients for sick visits, but many do, so it is worth a call to see if yours does. If you don't know where your local health department is, you can check in your phone book. The CDC also has a tool to help you locate your state's health department, and many state websites also have listings of all of their local health departments.  3) Find a Walk-in Clinic If your illness is not likely to be very severe or complicated, you may want to try a walk in clinic. These are popping up all over the country in pharmacies, drugstores, and shopping centers. They're usually staffed by nurse practitioners or physician assistants that have been trained to treat common illnesses and complaints. They're usually fairly quick and inexpensive. However, if you have serious medical issues or chronic medical problems, these are probably not your best option.  No Primary Care Doctor: - Call Health Connect at  [phone] - they can help you locate a primary care doctor that  accepts your insurance, provides certain services, etc. - Physician Referral Service- [phone]  Chronic Pain Problems: Organization         Address  Phone   Notes  [facility]  [phone] Patients need to be referred by their primary care doctor.   Medication Assistance: Organization         Address  Phone   Notes  [facility] [address] [place], [place] [address] [phone] --Must be a resident of [facility] [place] -- Must have NO insurance coverage whatsoever (no Medicaid/ Medicare, etc.) -- The pt. MUST have a primary care doctor that directs their care regularly and follows them in the community   MedAssist  [phone]   [employer]  [phone]    Agencies that provide inexpensive medical  care: Organization         Address  Phone   Notes  [facility]  [phone]) [phone]   [facility]    [phone]) [phone]   [facility] [address] [place], [place] [address] [phone]   [facility] [address], [place] [phone]   [facility]anned [facility]    [phone]   [facility]    [phone]   [facility]  [address], [place] Phone:  [phone], Fax:  [phone] Hours of Operation:  9 am - 6 pm, M-F.  Also accepts Medicaid/Medicare and self-pay.  [facility]  [address], [place] Phone: [phone], Fax: [phone]. Hours of Operation:  8:30 am - 5:30 pm, M-F.  Also accepts Medicaid and self-pay.  [facility] [place] [address], [place] Phone: [phone]   [facility] [address] Natasha Bence [place], [place] [phone], Ext. [phone] Mondays & Thursdays: 7-9 AM.  First 15 patients are seen on a first come, first serve basis.    Medicaid-accepting [facility] Providers:  Organization         Address  Phone   Notes  [facility] [address], [place] [phone] Also accepts self-pay patients.  [facility] [address] Laurell Josephs [place], [place]  [phone]   [facility] [address], [place] [phone]   [facility] [address] [place] [phone]   Renaye Rakers [address], [place]   [phone] Only accepts [place] Access [place] patients after they have their name applied to their card.   Self-Pay (no insurance) in [facility]:  Organization         Address  Phone   Notes  Sickle Cell Patients, [facility] [address] [place], [place] [phone]   [facility] [address] [place],  [place] [phone]   [facility] [place]  [address], [place] [phone]   [facility]/Dr. Osei-Bonsu  [address] [place] or [address], [place] [phone] Phone number for both [place] and [place] locations is the same.  [facility] [address] [place] [phone]   [facility] [address] [place] or [address] [phone]   [facility] [address] [place] [phone], phone; [phone], fax Sees patients 1st and 3rd Saturday of every month.  Must not qualify for public or private insurance (i.e. Medicaid, Medicare, [place] Health Choice, Veterans' Benefits)  Household income should be no more than 200% of the poverty level The clinic cannot treat you if you are pregnant or think you are pregnant  Sexually transmitted diseases are not treated at the clinic.    Dental Care: Organization         Address  Phone  Notes  [facility] [address] [place], [place] [phone] Accepts children up to age 42 who are enrolled in [place] or [place] Health Choice; pregnant women with a Medicaid card; and children who have applied for Medicaid or [place] Health Choice, but were declined, whose parents can pay a reduced fee at time of service.  [facility] Department of [facility]  [address], [place] [phone] Accepts children up to age 45 who are enrolled in [place] or [place] Health Choice; pregnant women with a Medicaid card; and children who have applied for Medicaid or [place] Health Choice, but were declined, whose parents can pay a reduced fee at time of service.  [facility]  [address] [place], [place] [phone] Patients are seen by appointment only. Walk-ins are  not accepted. [facility] will see patients 31 years of age and older. Monday - Tuesday (8am-5pm) Most Wednesdays (8:30-5pm) $30 per visit, cash only  [facility]  [address], [place] [facility] [place] [phone]) [phone] Patients are seen by appointment only. Walk-ins are not accepted. [facility] will see patients 31 years of age and older. One Wednesday Evening (Monthly: Volunteer Based).  $30 per visit, cash only  [facility]  [phone] for adults; Children under age 964, call Graduate Pediatric Dentistry at [phone]. Children aged 454-14, please call [phone] to request a pediatric application.  Dental services are provided in all areas of dental care including fillings, crowns and bridges, complete and partial dentures, implants, gum treatment, root canals, and extractions. Preventive care is also provided. Treatment is provided to both adults and children. Patients are selected via a lottery and there is often a waiting list.   [facility] [address], [place]  [phone]) [phone] [email or web address]   [facility] [address], [place], [place] [phone], Ext. [phone] Second and Fourth Thursday of each month, opens at 6:30 AM; Clinic ends at 9 AM.  Patients are seen on a first-come first-served basis, and a limited number are seen during each clinic.   [facility]  [address], [place], [place] [phone]   Eligibility Requirements You must have lived in [place], [place], or [place] counties for at least the last three months.   You cannot be eligible for state or federal sponsored National Cityhealthcare insurance, including CIGNAVeterans Administration, [place]icaid, or [employer] EntertainmentMedicare.   You generally cannot be eligible for healthcare insurance through your employer.    How to apply: Eligibility screenings are held every Tuesday and Wednesday afternoon from 1:00 pm until 4:00 pm. You do not need an appointment for the interview!   [facility] [address], [place], [place] [phone]   [facility]  [phone]   [facility]  [phone]   [facility]  [phone]    Behavioral Health Resources in the Community: Intensive Outpatient Programs Organization         Address  Phone  Notes  [facility] [address], [place], [place] [phone]   [facility] [address], [place], [place] [phone]   [facility] [address], [place], [place]  [phone]   [facility] [address],  [place], [place] [phone] or [phone]   Substance Abuse Resources Organization         Address  Phone  Notes  [facility]  [phone]   [facility]  [phone]   [facility]  [phone]   [facility]  [phone]   Residential & Outpatient Substance Abuse Program  [phone]   Psychological Services Organization         Address  Phone  Notes  [facility]  [phone]   [facility]  [phone]- [phone]   [facility] [address], [place] [phone] or [phone]    Mobile Crisis Teams Organization         Address  Phone  Notes  [facility], Mobile Crisis Care Unit  [phone]   [facility]  [address] [place], [place] [phone]   [facility] DeEsch [address] [place] [phone]    Self-Help/Support  Groups Organization         Address  Phone             Notes  [facility] [place] - variety of support groups  [phone]- [phone] Call for more information  Narcotics Anonymous (NA), [facility] [address], [employer] [place]  2 meetings at this location   Statistician         Address  Phone  Notes  [facility] [address],    [place]  [phone]   [facility]  [address], [place] [address], [place], [place] [phone]   [facility] [address] [place], [place] [phone] Admissions: 8am-3pm M-F  [facility] [address]    [place], [place] [phone]   [facility] [address] [place], [place], [place] [phone]   [facility] [address],  [place], [place] [phone]   [facility] - Intensive Outpatient [address] Laure[address] Josephs [address], [place], [place] [phone]   [facility] [address]  [place], [place] [phone] or [phone]   [facility] ([facility]) [address], [place], [place] [phone] Accepts Medicaid  [facility] [address]  [place] [phone] Substance Abuse/Addiction Treatment   [facility] Organization         Address  Phone  Notes  [facility]  [phone]   Angie Fava, PhD [address] Ervin Knack [place], [place]   [phone] or [phone]   [facility] Behavioral   [address] [place], [place] [phone]   [facility] [address] [place], [place] [phone] Insurance/Medicaid/sponsorship through [facility] and Families [address]                                    [place], [place] [phone] Therapy/tele-psych/case  [facility] [address], [place] [phone]    Dr. Lolly Mustache  [phone]   [facility] [place]  [employer] [facility] 1) [address] [place] 2) [address], [place] 3)  [address], [place] [phone]  [phone]   [facility] Child Abuse Hotline 346-243-4913 or [phone] (After Hours)

## 2014-01-06 ENCOUNTER — Encounter (HOSPITAL_COMMUNITY): Payer: Self-pay | Admitting: Emergency Medicine

## 2014-01-06 ENCOUNTER — Emergency Department (HOSPITAL_COMMUNITY)
Admission: EM | Admit: 2014-01-06 | Discharge: 2014-01-06 | Disposition: A | Discharge status: Home / Self Care | Payer: Self-pay | Attending: Emergency Medicine | Admitting: Emergency Medicine

## 2014-01-06 DIAGNOSIS — Z720 Tobacco use: Secondary | ICD-10-CM | POA: Insufficient documentation

## 2014-01-06 DIAGNOSIS — Z79899 Other long term (current) drug therapy: Secondary | ICD-10-CM | POA: Insufficient documentation

## 2014-01-06 DIAGNOSIS — G43909 Migraine, unspecified, not intractable, without status migrainosus: Secondary | ICD-10-CM | POA: Insufficient documentation

## 2014-01-06 DIAGNOSIS — F99 Mental disorder, not otherwise specified: Secondary | ICD-10-CM | POA: Insufficient documentation

## 2014-01-06 LAB — CBG MONITORING, ED: GLUCOSE-CAPILLARY: 76 mg/dL (ref 70–99)

## 2014-01-06 MED ORDER — DIPHENHYDRAMINE HCL 50 MG/ML IJ SOLN
25.0000 mg | Freq: Once | INTRAMUSCULAR | Status: AC
  Administered 2014-01-06: 25 mg via INTRAMUSCULAR
  Filled 2014-01-06: qty 1

## 2014-01-06 MED ORDER — KETOROLAC TROMETHAMINE 60 MG/2ML IM SOLN
60.0000 mg | Freq: Once | INTRAMUSCULAR | Status: AC
  Administered 2014-01-06: 60 mg via INTRAMUSCULAR
  Filled 2014-01-06: qty 2

## 2014-01-06 MED ORDER — PROCHLORPERAZINE EDISYLATE 5 MG/ML IJ SOLN
10.0000 mg | Freq: Four times a day (QID) | INTRAMUSCULAR | Status: DC | PRN
  Administered 2014-01-06: 10 mg via INTRAMUSCULAR
  Filled 2014-01-06: qty 2

## 2014-01-06 MED ORDER — SUMATRIPTAN SUCCINATE 100 MG PO TABS: 100.0000 mg | ORAL_TABLET | ORAL | Status: DC | PRN

## 2014-01-06 MED ORDER — TRAMADOL HCL 50 MG PO TABS: 50.0000 mg | ORAL_TABLET | Freq: Four times a day (QID) | ORAL | Status: DC | PRN

## 2014-01-06 NOTE — ED Notes (Signed)
Patient refused to sign for DC papers

## 2014-01-06 NOTE — ED Notes (Signed)
Patient ambulatory at time of DC Patient alert and oriented x 4 and able to ambulate in hallways without assistance from staff Patient in NAD at time of DC home Male visitor also leaving with patient EDP and ED Charge aware of issues with patient

## 2014-01-06 NOTE — Discharge Instructions (Signed)

## 2014-01-06 NOTE — ED Notes (Signed)
Entered patient room to DC patient  Patient eating/drinking and using cell phone Patient states that previously given medications have not helped with headache Patient refusing to leave because his pain has not been addressed and he "wants a prescription for Percocet" According to patient chart, patient DC from ED several days ago with Rx for Vicodin EDP made aware of patient complaints

## 2014-01-06 NOTE — ED Notes (Signed)
Patient with c/o headache--seen on 10/8 for same Patient with c/o hypoglycemia CBG 76 in triage Patient in NAD

## 2014-01-06 NOTE — ED Notes (Signed)
Patient refused DC VS 

## 2014-01-06 NOTE — ED Notes (Addendum)
Pt says "I have been having crazy migraines but as soon as I eat candy it goes away." Pt hx of migraines but also reports "both sides of my family have sugar problems". Seen last week for the same. Concerned he is having problems with his blood sugar. Denies increased thirst or urination.

## 2014-01-06 NOTE — ED Provider Notes (Signed)
CSN: [id number]     Arrival date & time 01/06/14  [id number] History   First MD Initiated Contact with Patient 01/06/14 1005     Chief Complaint  Patient presents with  . Migraine  . Hypoglycemia     (Consider location/radiation/quality/duration/timing/severity/associated sxs/prior Treatment) HPI Comments: Patient presents to the ER for evaluation of headache. Patient reports diffuse throbbing headache similar to migraines that he has had in the past. He reports mild nausea without vomiting. Headache is exacerbated by loud noises and bright lights. He has not had any neck pain or stiffness. There is no fever. No numbness, tingling or weakness in the extremities. He has had some vague complaints of myalgias recently, but no flu symptoms. No cough, congestion, upper respiratory infection symptoms.  Patient has become concerned that he is diabetic. He reports that there is a strong family history of diabetes. He is concerned about diabetes because when he has his headaches, he eats candy and the headache goes away.  Patient is a 33 y.o. male presenting with migraines and hypoglycemia.  Migraine Associated symptoms include headaches.  Hypoglycemia   Past Medical History  Diagnosis Date  . Migraine   . Ulcer   . Mental disorder    History reviewed. No pertinent past surgical history. No family history on file. History  Substance Use Topics  . Smoking status: Current Every Day Smoker -- 0.50 packs/day  . Smokeless tobacco: Never Used  . Alcohol Use: No    Review of Systems  Neurological: Positive for headaches.  All other systems reviewed and are negative.     Allergies  Review of patient's allergies indicates no known allergies.  Home Medications   Prior to Admission medications   Medication Sig Start Date End Date Taking? Authorizing Provider  citalopram (CELEXA) 20 MG tablet Take 1 tablet (20 mg total) by mouth daily. For depression/anxiety 02/17/13   Fransisca KaufmannLaura Davis, NP   HYDROcodone-acetaminophen (NORCO/VICODIN) 5-325 MG per tablet Take 1 tablet by mouth every 4 (four) hours as needed. 12/31/13   Kristen N Ward, DO  ibuprofen (ADVIL,MOTRIN) 200 MG tablet Take 800 mg by mouth every 6 (six) hours as needed for moderate pain (pain).     Historical Provider, MD  ibuprofen (ADVIL,MOTRIN) 800 MG tablet Take 1 tablet (800 mg total) by mouth every 8 (eight) hours as needed for mild pain. 12/31/13   Kristen N Ward, DO  omeprazole (PRILOSEC) 20 MG capsule Take 1 capsule (20 mg total) by mouth 2 (two) times daily before a meal. 02/17/13   Fransisca KaufmannLaura Davis, NP  traZODone (DESYREL) 50 MG tablet Take 50 mg by mouth at bedtime as needed for sleep (insomnia).    Historical Provider, MD   BP 144/73  Pulse 72  Temp(Src) 97.6 F (36.4 C) (Oral)  Resp 16  SpO2 100% Physical Exam  Constitutional: He is oriented to person, place, and time. He appears well-developed and well-nourished. No distress.  HENT:  Head: Normocephalic and atraumatic.  Right Ear: Hearing normal.  Left Ear: Hearing normal.  Nose: Nose normal.  Mouth/Throat: Oropharynx is clear and moist and mucous membranes are normal.  Eyes: Conjunctivae and EOM are normal. Pupils are equal, round, and reactive to light.  Neck: Normal range of motion. Neck supple.  Cardiovascular: Regular rhythm, S1 normal and S2 normal.  Exam reveals no gallop and no friction rub.   No murmur heard. Pulmonary/Chest: Effort normal and breath sounds normal. No respiratory distress. He exhibits no tenderness.  Abdominal: Soft. Normal appearance and  bowel sounds are normal. There is no hepatosplenomegaly. There is no tenderness. There is no rebound, no guarding, no tenderness at McBurney's point and negative Murphy's sign. No hernia.  Musculoskeletal: Normal range of motion.  Neurological: He is alert and oriented to person, place, and time. He has normal strength. No cranial nerve deficit or sensory deficit. Coordination normal. GCS eye subscore  is 4. GCS verbal subscore is 5. GCS motor subscore is 6.  Skin: Skin is warm, dry and intact. No rash noted. No cyanosis.  Psychiatric: He has a normal mood and affect. His speech is normal and behavior is normal. Thought content normal.    ED Course  Procedures (including critical care time) Labs Review Labs Reviewed  CBG MONITORING, ED    Imaging Review No results found.   EKG Interpretation None      MDM   Final diagnoses:  None   migraine headache  Patient with previously diagnosed migraine headaches presents to the ER with headache similar to his previous migraines. He has no concerning features and his examination is unremarkable. Reviewing her records reveals a recent presentation with complete resolution of similar symptoms with migraine cocktail. Patient will be administered treatment for acute migraine.  He was reassured, his blood sugar was in the normal range today. Looking through the records back to 2012, he has never had an abnormal glucose.    Gilda Creasehristopher J. Pollina, MD 01/06/14 1030

## 2014-01-06 NOTE — ED Notes (Signed)
Patient ambulatory from triage--gait steady, no assistance required Patient in NAD

## 2014-01-06 NOTE — ED Notes (Signed)
Dr. Pollina at bedside   

## 2014-01-06 NOTE — ED Notes (Signed)
Dr. Blinda LeatherwoodPollina at bedside EDP to give patient DC Rx

## 2016-04-11 ENCOUNTER — Emergency Department (HOSPITAL_COMMUNITY)
Admission: EM | Admit: 2016-04-11 | Discharge: 2016-04-12 | Disposition: A | Discharge status: Home / Self Care | Payer: PRIVATE HEALTH INSURANCE | Attending: Emergency Medicine | Admitting: Emergency Medicine

## 2016-04-11 ENCOUNTER — Emergency Department (HOSPITAL_COMMUNITY): Payer: PRIVATE HEALTH INSURANCE

## 2016-04-11 DIAGNOSIS — F17200 Nicotine dependence, unspecified, uncomplicated: Secondary | ICD-10-CM | POA: Insufficient documentation

## 2016-04-11 DIAGNOSIS — Z79899 Other long term (current) drug therapy: Secondary | ICD-10-CM | POA: Insufficient documentation

## 2016-04-11 DIAGNOSIS — G43809 Other migraine, not intractable, without status migrainosus: Secondary | ICD-10-CM | POA: Insufficient documentation

## 2016-04-11 DIAGNOSIS — R51 Headache: Secondary | ICD-10-CM | POA: Diagnosis present

## 2016-04-11 MED ORDER — PROCHLORPERAZINE EDISYLATE 5 MG/ML IJ SOLN
10.0000 mg | Freq: Once | INTRAMUSCULAR | Status: AC
  Administered 2016-04-11: 10 mg via INTRAVENOUS
  Filled 2016-04-11: qty 2

## 2016-04-11 MED ORDER — DIPHENHYDRAMINE HCL 50 MG/ML IJ SOLN
25.0000 mg | Freq: Once | INTRAMUSCULAR | Status: AC
  Administered 2016-04-11: 25 mg via INTRAVENOUS
  Filled 2016-04-11: qty 1

## 2016-04-11 MED ORDER — KETOROLAC TROMETHAMINE 15 MG/ML IJ SOLN
15.0000 mg | Freq: Once | INTRAMUSCULAR | Status: AC
  Administered 2016-04-11: 15 mg via INTRAVENOUS
  Filled 2016-04-11: qty 1

## 2016-04-11 MED ORDER — SODIUM CHLORIDE 0.9 % IV BOLUS (SEPSIS)
1000.0000 mL | Freq: Once | INTRAVENOUS | Status: AC
  Administered 2016-04-11: 1000 mL via INTRAVENOUS

## 2016-04-11 MED ORDER — DEXAMETHASONE SODIUM PHOSPHATE 10 MG/ML IJ SOLN
10.0000 mg | Freq: Once | INTRAMUSCULAR | Status: AC
Start: 2016-04-11 — End: 2016-04-11
  Administered 2016-04-11: 10 mg via INTRAVENOUS
  Filled 2016-04-11: qty 1

## 2016-04-11 NOTE — ED Notes (Signed)
Pt complaining on migraine headache on right side of head and generalized back pain. Pt states he has been having migraines all his life and would like to fix this. Pt has a physically strenuous job.

## 2016-04-12 MED ORDER — METOCLOPRAMIDE HCL 10 MG PO TABS: 10.0000 mg | ORAL_TABLET | Freq: Three times a day (TID) | ORAL | 0 refills | Status: DC | PRN

## 2016-04-12 MED ORDER — BUTALBITAL-APAP-CAFFEINE 50-325-40 MG PO TABS: 1.0000 | ORAL_TABLET | Freq: Four times a day (QID) | ORAL | 0 refills | Status: AC | PRN

## 2016-04-12 NOTE — ED Notes (Signed)
Provided graham crackers and apple juice with permission from provider.

## 2016-04-12 NOTE — ED Provider Notes (Signed)
[facility]-EMERGENCY DEPT Provider Note   CSN: [id number] Arrival date & time: 04/11/16  2105     History   Chief Complaint Chief Complaint  Patient presents with  . Headache  . Back Pain    HPI [name] is a 36 y.o. male.  HPI  36 yo M with h/o migraine HA here with headache. Pt states that he has had headaches since "age 36." He is here today with an acute on chronic HA that "won't go away." HA began several days ago gradually and has not resolved despite taking oTC medications. HA is throbbing, aching, and generalized. No radiation to the neck. No fever or chills. No vision changes, numbness, or weakness. He has not been worked up for these headaches. No worsening with position changes and HA are same throughout the day.   Past Medical History:  Diagnosis Date  . Mental disorder   . Migraine   . Ulcer     Patient Active Problem List   Diagnosis Date Noted  . 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 Marijuana dependence ([facility]) 02/13/2013    No past surgical history on file.     Home Medications    Prior to Admission medications   Medication Sig Start Date End Date Taking? Authorizing Provider  butalbital-acetaminophen-caffeine (FIORICET, ESGIC) 50-325-40 MG tablet Take 1-2 tablets by mouth every 6 (six) hours as needed for migraine. 04/12/16 04/12/17  Shaune Pollackameron Rashied Corallo, MD  citalopram (CELEXA) 20 MG tablet Take 1 tablet (20 mg total) by mouth daily. For depression/anxiety Patient not taking: Reported on 04/11/2016 02/17/13   Thermon LeylandLaura A Davis, NP  metoCLOPramide (REGLAN) 10 MG tablet Take 1 tablet (10 mg total) by mouth every 8 (eight) hours as needed for nausea (or headache). 04/12/16   Shaune Pollackameron Rainna Nearhood, MD  SUMAtriptan (IMITREX) 100 MG tablet Take 1 tablet (100 mg total) by mouth every 2 (two) hours as needed for migraine or headache. May repeat in 2 hours if headache persists or recurs. Patient not taking: Reported on 04/11/2016 01/06/14   Gilda Creasehristopher J Pollina, MD     Family History No family history on file.  Social History Social History  Substance Use Topics  . Smoking status: Current Every Day Smoker    Packs/day: 0.50  . Smokeless tobacco: Never Used  . Alcohol use No     Allergies   Patient has no known allergies.   Review of Systems Review of Systems  Constitutional: Positive for fatigue. Negative for chills and fever.  HENT: Negative for congestion and rhinorrhea.   Eyes: Negative for visual disturbance.  Respiratory: Negative for cough, shortness of breath and wheezing.   Cardiovascular: Negative for chest pain and leg swelling.  Gastrointestinal: Negative for abdominal pain, diarrhea, nausea and vomiting.  Genitourinary: Negative for dysuria and flank pain.  Musculoskeletal: Negative for neck pain and neck stiffness.  Skin: Negative for rash and wound.  Allergic/Immunologic: Negative for immunocompromised state.  Neurological: Positive for headaches. Negative for syncope and weakness.  All other systems reviewed and are negative.    Physical Exam Updated Vital Signs BP 129/77 (BP Location: Left Arm)   Pulse 69   Temp 98.1 F (36.7 C) (Oral)   Resp 18   Ht 5\' 10"  (1.778 m)   Wt 150 lb (68 kg)   SpO2 99%   BMI 21.52 kg/m   Physical Exam  Constitutional: He is oriented to person, place, and time. He appears well-developed and well-nourished. No distress.  HENT:  Head: Normocephalic and  atraumatic.  Eyes: Conjunctivae are normal.  Neck: Normal range of motion. Neck supple.  Cardiovascular: Normal rate, regular rhythm and normal heart sounds.  Exam reveals no friction rub.   No murmur heard. Pulmonary/Chest: Effort normal and breath sounds normal. No respiratory distress. He has no wheezes. He has no rales.  Abdominal: He exhibits no distension.  Musculoskeletal: He exhibits no edema.  Neurological: He is alert and oriented to person, place, and time. He exhibits normal muscle tone.  Skin: Skin is warm.  Capillary refill takes less than 2 seconds.  Psychiatric: He has a normal mood and affect.  Nursing note and vitals reviewed.   Neurological Exam:  Mental Status: Alert and oriented to person, place, and time. Attention and concentration normal. Speech clear. Recent memory is intact. Cranial Nerves: Visual fields grossly intact. EOMI and PERRLA. No nystagmus noted. Facial sensation intact at forehead, maxillary cheek, and chin/mandible bilaterally. No facial asymmetry or weakness. Hearing grossly normal. Uvula is midline, and palate elevates symmetrically. Normal SCM and trapezius strength. Tongue midline without fasciculations. Motor: Muscle strength 5/5 in proximal and distal UE and LE bilaterally. No pronator drift. Muscle tone normal. Reflexes: 2+ and symmetrical in all four extremities.  Sensation: Intact to light touch in upper and lower extremities distally bilaterally.  Gait: Normal without ataxia. Coordination: Normal FTN bilaterally.   ED Treatments / Results  Labs (all labs ordered are listed, but only abnormal results are displayed) Labs Reviewed - No data to display  EKG  EKG Interpretation None       Radiology Ct Head Wo Contrast  Result Date: 04/11/2016 CLINICAL DATA:  Migraine headache on right side of head and generalized back pain. EXAM: CT HEAD WITHOUT CONTRAST TECHNIQUE: Contiguous axial images were obtained from the base of the skull through the vertex without intravenous contrast. COMPARISON:  Head CT dated 05/20/2008. FINDINGS: Brain: Ventricles are normal in size and configuration. All areas of the brain demonstrate normal gray-white matter attenuation. There is no mass, hemorrhage, edema or other evidence of acute parenchymal abnormality. No extra-axial hemorrhage. Vascular: No hyperdense vessel or unexpected calcification. Skull: Normal. Negative for fracture or focal lesion. Sinuses/Orbits: No acute finding. Other: None. IMPRESSION: Normal head CT.  Electronically Signed   By: Bary Richard M.D.   On: 04/11/2016 23:03    Procedures Procedures (including critical care time)  Medications Ordered in ED Medications  sodium chloride 0.9 % bolus 1,000 mL (0 mLs Intravenous Stopped 04/12/16 0008)  prochlorperazine (COMPAZINE) injection 10 mg (10 mg Intravenous Given 04/11/16 2323)  diphenhydrAMINE (BENADRYL) injection 25 mg (25 mg Intravenous Given 04/11/16 2323)  ketorolac (TORADOL) 15 MG/ML injection 15 mg (15 mg Intravenous Given 04/11/16 2323)  dexamethasone (DECADRON) injection 10 mg (10 mg Intravenous Given 04/11/16 2323)     Initial Impression / Assessment and Plan / ED Course  I have reviewed the triage vital signs and the nursing notes.  Pertinent labs & imaging results that were available during my care of the patient were reviewed by me and considered in my medical decision making (see chart for details).    36 yo M with no significant PMHx here with acute on chronic HA. Suspect migraine versus tension versus other primary HA syndrome. Sx resolved with migraine cocktail here. No other red flags. Given chronicity of his HA without work-up, CT obtained and is neg, and he has no focal weakness, numbness, or other evidence to suggest CVA or intracranial lesion. No fever, neck stiffness, or s/s meningitis or encephalitis.  HA began gradually and is not c/w SAH. Will treat with oupt migraine meds, neuro referral.  Final Clinical Impressions(s) / ED Diagnoses   Final diagnoses:  Other migraine without status migrainosus, not intractable    New Prescriptions Discharge Medication List as of 04/12/2016 12:33 AM    START taking these medications   Details  butalbital-acetaminophen-caffeine (FIORICET, ESGIC) 50-325-40 MG tablet Take 1-2 tablets by mouth every 6 (six) hours as needed for migraine., Starting Fri 04/12/2016, Until Sat 04/12/2017, Print    metoCLOPramide (REGLAN) 10 MG tablet Take 1 tablet (10 mg total) by mouth every 8 (eight)  hours as needed for nausea (or headache)., Starting Fri 04/12/2016, Print         Shaune Pollack, MD 04/12/16 1225

## 2018-10-14 ENCOUNTER — Encounter (HOSPITAL_COMMUNITY): Payer: Self-pay | Admitting: Obstetrics and Gynecology

## 2018-10-14 ENCOUNTER — Emergency Department (HOSPITAL_COMMUNITY)
Admission: EM | Admit: 2018-10-14 | Discharge: 2018-10-14 | Disposition: A | Discharge status: Home / Self Care | Payer: Medicaid Other | Attending: Emergency Medicine | Admitting: Emergency Medicine

## 2018-10-14 ENCOUNTER — Other Ambulatory Visit: Payer: Self-pay

## 2018-10-14 DIAGNOSIS — F17200 Nicotine dependence, unspecified, uncomplicated: Secondary | ICD-10-CM | POA: Insufficient documentation

## 2018-10-14 DIAGNOSIS — R51 Headache: Secondary | ICD-10-CM | POA: Diagnosis not present

## 2018-10-14 DIAGNOSIS — R519 Headache, unspecified: Secondary | ICD-10-CM

## 2018-10-14 DIAGNOSIS — R030 Elevated blood-pressure reading, without diagnosis of hypertension: Secondary | ICD-10-CM | POA: Diagnosis not present

## 2018-10-14 LAB — CBG MONITORING, ED: Glucose-Capillary: 107 mg/dL — ABNORMAL HIGH (ref 70–99)

## 2018-10-14 MED ORDER — PROCHLORPERAZINE EDISYLATE 10 MG/2ML IJ SOLN
10.0000 mg | Freq: Once | INTRAMUSCULAR | Status: AC
Start: 2018-10-14 — End: 2018-10-14
  Administered 2018-10-14: 16:00:00 10 mg via INTRAVENOUS
  Filled 2018-10-14: qty 2

## 2018-10-14 MED ORDER — DEXAMETHASONE SODIUM PHOSPHATE 10 MG/ML IJ SOLN
10.0000 mg | Freq: Once | INTRAMUSCULAR | Status: AC
  Administered 2018-10-14: 16:00:00 10 mg via INTRAVENOUS
  Filled 2018-10-14: qty 1

## 2018-10-14 MED ORDER — ACETAMINOPHEN 500 MG PO TABS: 1000.0000 mg | ORAL_TABLET | Freq: Once | ORAL | Status: DC

## 2018-10-14 MED ORDER — DIPHENHYDRAMINE HCL 50 MG/ML IJ SOLN
25.0000 mg | Freq: Once | INTRAMUSCULAR | Status: AC
  Administered 2018-10-14: 25 mg via INTRAVENOUS
  Filled 2018-10-14: qty 1

## 2018-10-14 MED ORDER — SODIUM CHLORIDE 0.9 % IV BOLUS
1000.0000 mL | Freq: Once | INTRAVENOUS | Status: AC
  Administered 2018-10-14: 1000 mL via INTRAVENOUS

## 2018-10-14 MED ORDER — METOCLOPRAMIDE HCL 10 MG PO TABS: 10.0000 mg | ORAL_TABLET | Freq: Three times a day (TID) | ORAL | 0 refills | Status: DC | PRN

## 2018-10-14 NOTE — ED Notes (Signed)
Patient given discharge teaching and verbalized understanding. Patient ambulated out of ED with a steady gait. 

## 2018-10-14 NOTE — ED Triage Notes (Signed)
Pt reports migraine x7 days. Pt reports multiple headaches that wake him from sleep.

## 2018-10-14 NOTE — ED Provider Notes (Signed)
[facility] DEPT Provider Note   CSN: [id number] Arrival date & time: 10/14/18  1226     History   Chief Complaint Chief Complaint  Patient presents with  . Migraine    HPI [name] is a 38 y.o. male.     HPI   Patient is a 38 year old male with a past medical history of migraines, substance-induced mood disorder presenting for recurrent headaches.  Patient reports that he will often go months without a headache and then have a series of headaches for a week or more.  Headaches tend to last 1 to 2 hours at a time.  He reports that they are right sided, around his eye and in the temporal region.  He reports that it will often make his right eye tear up.  Denies visual disturbance, change in speech, facial weakness, numbness or tingling, weakness extremities, or change in gait.  Patient reports that he is tried multiple prescription and over-the-counter medications such as triptans, Fioricet, however they have not worked previously.  In this particular episode over the past week he has tried [facility] powder however it upset his stomach.  Patient has never been seen by neurology.  Past Medical History:  Diagnosis Date  . Mental disorder   . Migraine   . Ulcer     Patient Active Problem List   Diagnosis Date Noted  . Mood disorder, drug-induced ([place]) 02/13/2013  . Marijuana dependence ([place]) 02/13/2013    History reviewed. No pertinent surgical history.      Home Medications    Prior to Admission medications   Medication Sig Start Date End Date Taking? Authorizing Provider  citalopram (CELEXA) 20 MG tablet Take 1 tablet (20 mg total) by mouth daily. For depression/anxiety Patient not taking: Reported on 04/11/2016 02/17/13   Niel Hummer, NP  metoCLOPramide (REGLAN) 10 MG tablet Take 1 tablet (10 mg total) by mouth every 8 (eight) hours as needed for nausea (or headache). 04/12/16   Duffy Bruce, MD  SUMAtriptan (IMITREX) 100 MG tablet Take 1  tablet (100 mg total) by mouth every 2 (two) hours as needed for migraine or headache. May repeat in 2 hours if headache persists or recurs. Patient not taking: Reported on 04/11/2016 01/06/14   Orpah Greek, MD    Family History No family history on file.  Social History Social History   Tobacco Use  . Smoking status: Current Every Day Smoker    Packs/day: 0.50  . Smokeless tobacco: Never Used  Substance Use Topics  . Alcohol use: No  . Drug use: Yes    Types: Marijuana     Allergies   Patient has no known allergies.   Review of Systems Review of Systems  Constitutional: Negative for chills and fever.  HENT: Negative for congestion and sore throat.   Eyes: Positive for photophobia. Negative for visual disturbance.  Respiratory: Negative for cough, chest tightness and shortness of breath.   Cardiovascular: Negative for chest pain.  Gastrointestinal: Positive for nausea. Negative for abdominal pain and vomiting.  Genitourinary: Negative for dysuria and flank pain.  Musculoskeletal: Negative for back pain and myalgias.  Skin: Negative for rash.  Neurological: Positive for headaches. Negative for dizziness, syncope and light-headedness.  All other systems reviewed and are negative.    Physical Exam Updated Vital Signs BP (!) 143/101 (BP Location: Left Arm)   Pulse 67   Temp 98.2 F (36.8 C) (Oral)   Resp 17   SpO2 98%  Physical Exam Vitals signs and nursing note reviewed.  Constitutional:      General: He is not in acute distress.    Appearance: He is well-developed.  HENT:     Head: Normocephalic and atraumatic.     Mouth/Throat:     Mouth: Mucous membranes are moist.  Eyes:     Conjunctiva/sclera: Conjunctivae normal.     Pupils: Pupils are equal, round, and reactive to light.  Neck:     Musculoskeletal: Normal range of motion and neck supple. No neck rigidity.  Cardiovascular:     Rate and Rhythm: Normal rate and regular rhythm.     Heart  sounds: S1 normal and S2 normal. No murmur.  Pulmonary:     Effort: Pulmonary effort is normal.     Breath sounds: Normal breath sounds. No wheezing or rales.  Abdominal:     General: There is no distension.     Palpations: Abdomen is soft.     Tenderness: There is no abdominal tenderness. There is no guarding.  Musculoskeletal: Normal range of motion.        General: No deformity.  Lymphadenopathy:     Cervical: No cervical adenopathy.  Skin:    General: Skin is warm and dry.     Findings: No erythema or rash.  Neurological:     Mental Status: He is alert.     Comments: Mental Status:  Alert, oriented, thought content appropriate, able to give a coherent history. Speech fluent without evidence of aphasia. Able to follow 2 step commands without difficulty.  Cranial Nerves:  II:  Peripheral visual fields grossly normal, pupils equal, round, reactive to light III,IV, VI: ptosis not present, extra-ocular motions intact bilaterally  V,VII: smile symmetric, facial light touch sensation equal VIII: hearing grossly normal to voice  X: uvula elevates symmetrically  XI: bilateral shoulder shrug symmetric and strong XII: midline tongue extension without fassiculations Motor:  Normal tone. 5/5 in upper and lower extremities bilaterally including strong and equal grip strength and dorsiflexion/plantar flexion Sensory: Light touch normal in all extremities.  Deep Tendon Reflexes: 2+ and symmetric in the biceps and patella. No clonus. Cerebellar: normal finger-to-nose with bilateral upper extremities Gait: normal gait and balance Stance: Romberg negative. No pronator drift and good coordination, strength, and position sense with tapping of bilateral arms (performed in sitting position). CV: distal pulses palpable throughout    Psychiatric:        Behavior: Behavior normal.        Thought Content: Thought content normal.        Judgment: Judgment normal.      ED Treatments / Results   Labs (all labs ordered are listed, but only abnormal results are displayed) Labs Reviewed  CBG MONITORING, ED - Abnormal; Notable for the following components:      Result Value   Glucose-Capillary 107 (*)    All other components within normal limits    EKG None  Radiology No results found.  Procedures Procedures (including critical care time)  Medications Ordered in ED Medications - No data to display   Initial Impression / Assessment and Plan / ED Course  I have reviewed the triage vital signs and the nursing notes.  Pertinent labs & imaging results that were available during my care of the patient were reviewed by me and considered in my medical decision making (see chart for details).        This is a well-appearing 38 year old male, neurologically intact, with a history of  recurrent headaches presenting for ongoing episodes of headaches over the past week.  A motor diagnosis includes migraines versus cluster headaches.  Particularly suspicious for cluster headaches given history of unilateral lacrimation with headaches, and episodic nature headaches that are severe and one-sided.  Patient without high-risk features of headache including: sudden onset/thunderclap HA, no similar headache in past, altered mental status, accompanying seizure, headache with exertion, age > 7950, history of immunocompromise, neck or shoulder pain, fever, use of anticoagulation, family history of spontaneous SAH, concomitant drug use, toxic exposure.   Patient has a normal complete neurological exam, normal vital signs, normal level of consciousness, no signs of meningismus, is well-appearing/non-toxic appearing, no signs of trauma.   Imaging with CT/MRI not indicated given history and physical exam findings.  Patient has significant improvement with high flow oxygen delivered via nonrebreather, migraine cocktail, fluids.  He reports headache is completely resolved.  No dangerous or life-threatening  conditions suspected or identified by history, physical exam, and by work-up.  Patient referred to neurology for further management and work-up.  Patient also has elevated blood pressure in the emergency department.  Suspect noncontributory to these particular headaches, but will need further reassessment.  He is instructed to establish care with PCP.  Final Clinical Impressions(s) / ED Diagnoses   Final diagnoses:  Bad headache  Elevated blood pressure reading    ED Discharge Orders         Ordered    Ambulatory referral to Neurology    Comments: An appointment is requested in approximately: 4 weeks   10/14/18 1712    metoCLOPramide (REGLAN) 10 MG tablet  Every 8 hours PRN     10/14/18 1715           Delia ChimesMurray, Alyssa B, PA-C 10/14/18 1845    Donnetta Hutchingook, Brian, MD 10/14/18 1940

## 2018-10-14 NOTE — Discharge Instructions (Signed)
Please see the information and instructions below regarding your visit.  Your diagnoses today include:  1. Bad headache   2. Elevated blood pressure reading     You were seen and treated in the emergency department today for headache. Fortunately, your vitals, exam, and work-up is reassuring with no apparent emergent cause for your headache at this time.  Tests performed today include: See side panel of your discharge paperwork for testing performed today. Vital signs are listed at the bottom of these instructions.   Medications prescribed:    Try to avoid daily or regular use of tylenol, aspirin, ibuprofen, and other overt-the-counter pain medications as this can contribute to rebound headaches.   Take any prescribed medications only as prescribed, and any over the counter medications only as directed on the packaging.  Home care instructions:   Drink plenty of fluids at home. This will help with your headache. Be cautious with caffeine use, as this can cause your headache to rebound when the effects wear off. If you drink more than 2 cups of coffee/caffeinated tea, or caffeinated soda per day, I suggest you wean down that amount.  Please follow any educational materials contained in this packet.   Follow-up instructions: Please follow-up with [facility].   Please also follow-up with the primary care resources I provided.  You need a blood pressure recheck.  Return instructions:  Please return to the Emergency Department if you experience worsening symptoms. It is VERY important that you monitor your symptoms at home. If you develop worsening headache, new fever, new neck stiffness, rash, focal weakness or numbness, or any other new or concerning symptoms, please return to the ED immediately, as these may be signs that your headache has become a potentially serious and life-threatening condition.  Please return if you have any other emergent  concerns.  Additional Information:   Your vital signs today were: BP (!) 151/95 (BP Location: Left Arm)    Pulse 70    Temp 98.2 F (36.8 C) (Oral)    Resp 15    SpO2 99%  If your blood pressure (BP) was elevated on multiple readings during this visit above 130 for the top number or above 80 for the bottom number, please have this repeated by your primary care provider within one month. --------------  Thank you for allowing [place] to participate in your care today.

## 2018-11-19 ENCOUNTER — Encounter: Payer: Self-pay | Admitting: Neurology

## 2018-11-19 ENCOUNTER — Ambulatory Visit: Payer: PRIVATE HEALTH INSURANCE | Admitting: Neurology

## 2020-05-18 ENCOUNTER — Encounter: Payer: PRIVATE HEALTH INSURANCE | Admitting: Internal Medicine

## 2020-05-24 ENCOUNTER — Telehealth: Payer: Self-pay | Admitting: *Deleted

## 2020-05-24 ENCOUNTER — Encounter: Payer: PRIVATE HEALTH INSURANCE | Admitting: Internal Medicine

## 2020-05-24 NOTE — Telephone Encounter (Signed)
Called patient left voice message for patient to return call to clinic to reschedule missed appointment for 1:15pm today. Patient needs to call clinic at [phone].

## 2021-05-27 ENCOUNTER — Emergency Department (HOSPITAL_COMMUNITY): Payer: Medicaid Other

## 2021-05-27 ENCOUNTER — Other Ambulatory Visit: Payer: Self-pay

## 2021-05-27 ENCOUNTER — Emergency Department (HOSPITAL_COMMUNITY)
Admission: EM | Admit: 2021-05-27 | Discharge: 2021-05-27 | Disposition: A | Discharge status: Home / Self Care | Payer: Medicaid Other | Attending: Emergency Medicine | Admitting: Emergency Medicine

## 2021-05-27 ENCOUNTER — Encounter (HOSPITAL_COMMUNITY): Payer: Self-pay | Admitting: Emergency Medicine

## 2021-05-27 DIAGNOSIS — D509 Iron deficiency anemia, unspecified: Secondary | ICD-10-CM | POA: Diagnosis not present

## 2021-05-27 DIAGNOSIS — R1084 Generalized abdominal pain: Secondary | ICD-10-CM | POA: Insufficient documentation

## 2021-05-27 DIAGNOSIS — K7689 Other specified diseases of liver: Secondary | ICD-10-CM | POA: Diagnosis not present

## 2021-05-27 LAB — CBC WITH DIFFERENTIAL/PLATELET
Abs Immature Granulocytes: 0.01 10*3/uL (ref 0.00–0.07)
Basophils Absolute: 0 10*3/uL (ref 0.0–0.1)
Basophils Relative: 0 %
Eosinophils Absolute: 0.2 10*3/uL (ref 0.0–0.5)
Eosinophils Relative: 2 %
HCT: 33 % — ABNORMAL LOW (ref 39.0–52.0)
Hemoglobin: 9.6 g/dL — ABNORMAL LOW (ref 13.0–17.0)
Immature Granulocytes: 0 %
Lymphocytes Relative: 41 %
Lymphs Abs: 2.9 10*3/uL (ref 0.7–4.0)
MCH: 21.4 pg — ABNORMAL LOW (ref 26.0–34.0)
MCHC: 29.1 g/dL — ABNORMAL LOW (ref 30.0–36.0)
MCV: 73.5 fL — ABNORMAL LOW (ref 80.0–100.0)
Monocytes Absolute: 0.4 10*3/uL (ref 0.1–1.0)
Monocytes Relative: 6 %
Neutro Abs: 3.6 10*3/uL (ref 1.7–7.7)
Neutrophils Relative %: 51 %
Platelets: 432 10*3/uL — ABNORMAL HIGH (ref 150–400)
RBC: 4.49 MIL/uL (ref 4.22–5.81)
RDW: 20.7 % — ABNORMAL HIGH (ref 11.5–15.5)
WBC: 7.1 10*3/uL (ref 4.0–10.5)
nRBC: 0 % (ref 0.0–0.2)

## 2021-05-27 LAB — COMPREHENSIVE METABOLIC PANEL
ALT: 13 U/L (ref 0–44)
AST: 29 U/L (ref 15–41)
Albumin: 3.3 g/dL — ABNORMAL LOW (ref 3.5–5.0)
Alkaline Phosphatase: 87 U/L (ref 38–126)
Anion gap: 5 (ref 5–15)
BUN: 8 mg/dL (ref 6–20)
CO2: 28 mmol/L (ref 22–32)
Calcium: 9 mg/dL (ref 8.9–10.3)
Chloride: 100 mmol/L (ref 98–111)
Creatinine, Ser: 0.99 mg/dL (ref 0.61–1.24)
GFR, Estimated: >60 mL/min (ref >60)
Glucose, Bld: 83 mg/dL (ref 70–99)
Potassium: 3.8 mmol/L (ref 3.5–5.1)
Sodium: 133 mmol/L — ABNORMAL LOW (ref 135–145)
Total Bilirubin: <0.1 mg/dL — ABNORMAL LOW (ref 0.3–1.2)
Total Protein: 7.9 g/dL (ref 6.5–8.1)

## 2021-05-27 LAB — POC OCCULT BLOOD, ED: Fecal Occult Bld: NEGATIVE

## 2021-05-27 LAB — LIPASE, BLOOD: Lipase: 25 U/L (ref 11–51)

## 2021-05-27 MED ORDER — DICYCLOMINE HCL 20 MG PO TABS: 20.0000 mg | ORAL_TABLET | Freq: Three times a day (TID) | ORAL | 0 refills | Status: DC | PRN

## 2021-05-27 MED ORDER — FERROUS SULFATE 325 (65 FE) MG PO TABS: 325.0000 mg | ORAL_TABLET | Freq: Every day | ORAL | 0 refills | Status: DC

## 2021-05-27 MED ORDER — IOHEXOL 300 MG/ML  SOLN
100.0000 mL | Freq: Once | INTRAMUSCULAR | Status: AC | PRN
  Administered 2021-05-27: 100 mL via INTRAVENOUS

## 2021-05-27 MED ORDER — FERROUS SULFATE 325 (65 FE) MG PO TABS: 325.0000 mg | ORAL_TABLET | Freq: Two times a day (BID) | ORAL | 0 refills | Status: DC

## 2021-05-27 NOTE — ED Triage Notes (Signed)
Pt reports generalized abdominal pain that is worse after eating. Pt also reports decreased PO intake. Denies n/v/d.  ?

## 2021-05-27 NOTE — ED Provider Notes (Signed)
?[facility]-EMERGENCY DEPT ?Provider Note ? ? ?CSN: [id number] ?Arrival date & time: 05/27/21  1747 ? ?  ? ?History ? ?Chief Complaint  ?Patient presents with  ? Abdominal Pain  ? ? ?[name] is a 41 y.o. male. ? ?HPI ?He presents for evaluation of intermittent abdominal pain for several weeks, diffuse in his abdomen, improves when he takes Mylanta or dicyclomine.  No prior diagnosis.  He has been using dicyclomine, but he got from a friend.  He denies fever, chills, cough, shortness of breath or chest pain.  He has occasional vomiting but no diarrhea.  He denies dysuria or urinary frequency.  He has not previously been evaluated for this problem. ?  ? ?Home Medications ?Prior to Admission medications   ?Medication Sig Start Date End Date Taking? Authorizing Provider  ?dicyclomine (BENTYL) 20 MG tablet Take 1 tablet (20 mg total) by mouth every 8 (eight) hours as needed (Abdominal cramping). 05/27/21  Yes Mancel Bale, MD  ?ferrous sulfate 325 (65 FE) MG tablet Take 1 tablet (325 mg total) by mouth 2 (two) times daily with a meal. 05/27/21  Yes Mancel Bale, MD  ?metoCLOPramide (REGLAN) 10 MG tablet Take 1 tablet (10 mg total) by mouth every 8 (eight) hours as needed for nausea (or headache). 10/14/18   Aviva Kluver B, PA-C  ?citalopram (CELEXA) 20 MG tablet Take 1 tablet (20 mg total) by mouth daily. For depression/anxiety ?Patient not taking: Reported on 04/11/2016 02/17/13 10/14/18  Thermon Leyland, NP  ?SUMAtriptan (IMITREX) 100 MG tablet Take 1 tablet (100 mg total) by mouth every 2 (two) hours as needed for migraine or headache. May repeat in 2 hours if headache persists or recurs. ?Patient not taking: Reported on 04/11/2016 01/06/14 10/14/18  Gilda Crease, MD  ?   ? ?Allergies    ?Patient has no known allergies.   ? ?Review of Systems   ?Review of Systems ? ?Physical Exam ?Updated Vital Signs ?BP (!) 149/101   Pulse 92   Temp 97.6 ?F (36.4 ?C) (Oral)   Resp 17   SpO2 100%   ?Physical Exam ?Vitals and nursing note reviewed.  ?Constitutional:   ?   General: He is not in acute distress. ?   Appearance: He is well-developed. He is not ill-appearing, toxic-appearing or diaphoretic.  ?HENT:  ?   Head: Normocephalic and atraumatic.  ?   Right Ear: External ear normal.  ?   Left Ear: External ear normal.  ?   Nose: No congestion.  ?Eyes:  ?   Conjunctiva/sclera: Conjunctivae normal.  ?   Pupils: Pupils are equal, round, and reactive to light.  ?Neck:  ?   Trachea: Phonation normal.  ?Cardiovascular:  ?   Rate and Rhythm: Normal rate.  ?Pulmonary:  ?   Effort: Pulmonary effort is normal.  ?Abdominal:  ?   General: There is no distension.  ?   Tenderness: There is no abdominal tenderness.  ?Musculoskeletal:     ?   General: No swelling or tenderness. Normal range of motion.  ?   Cervical back: Normal range of motion and neck supple.  ?Skin: ?   General: Skin is warm and dry.  ?Neurological:  ?   Mental Status: He is alert and oriented to person, place, and time.  ?   Cranial Nerves: No cranial nerve deficit.  ?   Sensory: No sensory deficit.  ?   Motor: No abnormal muscle tone.  ?   Coordination: Coordination  normal.  ?Psychiatric:     ?   Mood and Affect: Mood normal.     ?   Behavior: Behavior normal.     ?   Thought Content: Thought content normal.     ?   Judgment: Judgment normal.  ? ? ?ED Results / Procedures / Treatments   ?Labs ?(all labs ordered are listed, but only abnormal results are displayed) ?Labs Reviewed  ?COMPREHENSIVE METABOLIC PANEL - Abnormal; Notable for the following components:  ?    Result Value  ? Sodium 133 (*)   ? Albumin 3.3 (*)   ? Total Bilirubin <0.1 (*)   ? All other components within normal limits  ?CBC WITH DIFFERENTIAL/PLATELET - Abnormal; Notable for the following components:  ? Hemoglobin 9.6 (*)   ? HCT 33.0 (*)   ? MCV 73.5 (*)   ? MCH 21.4 (*)   ? MCHC 29.1 (*)   ? RDW 20.7 (*)   ? Platelets 432 (*)   ? All other components within normal limits  ?LIPASE,  BLOOD  ?POC OCCULT BLOOD, ED  ? ? ?EKG ?None ? ?Radiology ?CT Abdomen Pelvis W Contrast ? ?Result Date: 05/27/2021 ?CLINICAL DATA:  Acute abdominal pain. EXAM: CT ABDOMEN AND PELVIS WITH CONTRAST TECHNIQUE: Multidetector CT imaging of the abdomen and pelvis was performed using the standard protocol following bolus administration of intravenous contrast. RADIATION DOSE REDUCTION: This exam was performed according to the departmental dose-optimization program which includes automated exposure control, adjustment of the mA and/or kV according to patient size and/or use of iterative reconstruction technique. CONTRAST:  OMNIPAQUE IOHEXOL 300 MG/ML  SOLN COMPARISON:  None. FINDINGS: Lower chest: No acute abnormality. Hepatobiliary: There is a 13 mm cyst in the liver. Gallbladder and bile ducts are within normal limits. Pancreas: Unremarkable. No pancreatic ductal dilatation or surrounding inflammatory changes. Spleen: Normal in size without focal abnormality. Adrenals/Urinary Tract: Adrenal glands are unremarkable. Kidneys are normal, without renal calculi, focal lesion, or hydronephrosis. Bladder is unremarkable. Stomach/Bowel: Stomach is within normal limits. No evidence of bowel wall thickening, distention, or inflammatory changes. The appendix is not seen. Vascular/Lymphatic: No significant vascular findings are present. No enlarged abdominal or pelvic lymph nodes. Reproductive: Prostate is unremarkable. Other: No abdominal wall hernia or abnormality. No abdominopelvic ascites. Musculoskeletal: No acute or significant osseous findings. IMPRESSION: No acute localizing process in the abdomen or pelvis. Electronically Signed   By: Darliss Cheney M.D.   On: 05/27/2021 20:14   ? ?Procedures ?Procedures  ? ? ?Medications Ordered in ED ?Medications  ?iohexol (OMNIPAQUE) 300 MG/ML solution 100 mL (100 mLs Intravenous Contrast Given 05/27/21 2000)  ? ? ?ED Course/ Medical Decision Making/ A&P ?  ?                         ?Medical Decision Making ?Patient presenting with ongoing abdominal pain for 1 month, with occasional vomiting but no diarrhea.  No fever or prior diagnosis for this problem. ? ?Problems Addressed: ?Generalized abdominal pain: acute illness or injury ?   Details: Ongoing/intermittent.  Improves with symptomatic treatment. ? ?Amount and/or Complexity of Data Reviewed ?Independent Historian:  ?   Details: He is a cogent historian ?Labs: ordered. ?   Details: CBC, lipase, metabolic panel-normal except sodium low, albumin low, hemoglobin low, MCV low ?Radiology: ordered and independent interpretation performed. ?   Details: CT abdomen pelvis-no acute abnormalities. ? ?Risk ?Prescription drug management. ?Decision regarding hospitalization. ?Risk Details: Patient presenting for  ongoing abdominal pain for several weeks, which improves with symptomatic treatment.  No significant weight loss.  No prior work-ups for GI disorders.  Screening evaluation remarkable for incidental anemia.  Stools heme-negative here today.  He appears to have iron deficiency anemia.  He is stable for discharge does not require surgical intervention or hospitalization.  Anticipate outpatient evaluation for both GI disorder and anemia. ? ? ? ? ? ? ? ? ? ? ?Final Clinical Impression(s) / ED Diagnoses ?Final diagnoses:  ?Generalized abdominal pain  ?Iron deficiency anemia, unspecified iron deficiency anemia type  ? ? ?Rx / DC Orders ?ED Discharge Orders   ? ?      Ordered  ?  dicyclomine (BENTYL) 20 MG tablet  Every 8 hours PRN       ? 05/27/21 2118  ?  ferrous sulfate 325 (65 FE) MG tablet  2 times daily with meals       ? 05/27/21 2118  ? ?  ?  ? ?  ? ? ?  ?Mancel Bale, MD ?05/27/21 2124 ? ?

## 2021-05-27 NOTE — Discharge Instructions (Signed)
The findings today did not show any serious problems.  It is important to follow-up with a gastroenterologist for further evaluation and treatment.  We are prescribing dicyclomine to use as needed for pain.  We are also starting you on iron because your hemoglobin is low and you appear to have iron deficiency.  Get plenty of rest and drink a lot of fluids.  Return here if needed. ?

## 2021-05-27 NOTE — ED Notes (Signed)
Pt given turkey sandwich and drink 

## 2021-05-28 ENCOUNTER — Telehealth: Payer: Self-pay

## 2021-05-28 NOTE — Telephone Encounter (Signed)
Transition Care Management Unsuccessful Follow-up Telephone Call ? ?Date of discharge and from where:  05/27/2021-[facility]  ? ?Attempts:  1st Attempt ? ?Reason for unsuccessful TCM follow-up call:  Unable to reach patient ? ?  ?

## 2021-05-29 NOTE — Telephone Encounter (Signed)
Transition Care Management Unsuccessful Follow-up Telephone Call ? ?Date of discharge and from where:  05/27/2021-[facility] ? ?Attempts:  2nd Attempt ? ?Reason for unsuccessful TCM follow-up call:  Unable to reach patient ? ?  ?

## 2021-05-30 NOTE — Telephone Encounter (Signed)
Transition Care Management Unsuccessful Follow-up Telephone Call ? ?Date of discharge and from where:  05/27/2021 from [facility] ? ?Attempts:  3rd Attempt ? ?Reason for unsuccessful TCM follow-up call:  Unable to reach patient ? ? ? ?

## 2021-07-10 DIAGNOSIS — H5213 Myopia, bilateral: Secondary | ICD-10-CM | POA: Diagnosis not present

## 2021-07-17 ENCOUNTER — Inpatient Hospital Stay (HOSPITAL_COMMUNITY)
Admission: EM | Admit: 2021-07-17 | Discharge: 2021-07-24 | DRG: 326 | Disposition: A | Discharge status: Home / Self Care | Payer: Medicaid Other | Attending: General Surgery | Admitting: General Surgery

## 2021-07-17 ENCOUNTER — Other Ambulatory Visit: Payer: Self-pay

## 2021-07-17 ENCOUNTER — Encounter (HOSPITAL_COMMUNITY): Payer: Self-pay

## 2021-07-17 DIAGNOSIS — J189 Pneumonia, unspecified organism: Secondary | ICD-10-CM | POA: Diagnosis present

## 2021-07-17 DIAGNOSIS — F1994 Other psychoactive substance use, unspecified with psychoactive substance-induced mood disorder: Secondary | ICD-10-CM | POA: Diagnosis present

## 2021-07-17 DIAGNOSIS — K275 Chronic or unspecified peptic ulcer, site unspecified, with perforation: Secondary | ICD-10-CM

## 2021-07-17 DIAGNOSIS — K631 Perforation of intestine (nontraumatic): Secondary | ICD-10-CM | POA: Diagnosis not present

## 2021-07-17 DIAGNOSIS — R198 Other specified symptoms and signs involving the digestive system and abdomen: Principal | ICD-10-CM

## 2021-07-17 DIAGNOSIS — F1220 Cannabis dependence, uncomplicated: Secondary | ICD-10-CM | POA: Diagnosis present

## 2021-07-17 DIAGNOSIS — I10 Essential (primary) hypertension: Secondary | ICD-10-CM | POA: Diagnosis present

## 2021-07-17 DIAGNOSIS — B9681 Helicobacter pylori [H. pylori] as the cause of diseases classified elsewhere: Secondary | ICD-10-CM | POA: Diagnosis present

## 2021-07-17 DIAGNOSIS — K255 Chronic or unspecified gastric ulcer with perforation: Principal | ICD-10-CM | POA: Diagnosis present

## 2021-07-17 DIAGNOSIS — Z888 Allergy status to other drugs, medicaments and biological substances status: Secondary | ICD-10-CM

## 2021-07-17 DIAGNOSIS — F17210 Nicotine dependence, cigarettes, uncomplicated: Secondary | ICD-10-CM | POA: Diagnosis present

## 2021-07-17 LAB — COMPREHENSIVE METABOLIC PANEL
ALT: 9 U/L (ref 0–44)
AST: 11 U/L — ABNORMAL LOW (ref 15–41)
Albumin: 3.4 g/dL — ABNORMAL LOW (ref 3.5–5.0)
Alkaline Phosphatase: 86 U/L (ref 38–126)
Anion gap: 7 (ref 5–15)
BUN: 11 mg/dL (ref 6–20)
CO2: 30 mmol/L (ref 22–32)
Calcium: 9.8 mg/dL (ref 8.9–10.3)
Chloride: 100 mmol/L (ref 98–111)
Creatinine, Ser: 1.11 mg/dL (ref 0.61–1.24)
GFR, Estimated: >60 mL/min (ref >60)
Glucose, Bld: 122 mg/dL — ABNORMAL HIGH (ref 70–99)
Potassium: 3.6 mmol/L (ref 3.5–5.1)
Sodium: 137 mmol/L (ref 135–145)
Total Bilirubin: 0.4 mg/dL (ref 0.3–1.2)
Total Protein: 8 g/dL (ref 6.5–8.1)

## 2021-07-17 LAB — LIPASE, BLOOD: Lipase: 25 U/L (ref 11–51)

## 2021-07-17 MED ORDER — MORPHINE SULFATE (PF) 2 MG/ML IV SOLN
2.0000 mg | Freq: Once | INTRAVENOUS | Status: AC
  Administered 2021-07-17: 2 mg via INTRAVENOUS
  Filled 2021-07-17: qty 1

## 2021-07-17 NOTE — ED Provider Triage Note (Signed)
Emergency Medicine Provider Triage Evaluation Note ? ?[name] , a 41 y.o. male  was evaluated in triage.  Patient reports chronic abdominal pain.  Today it got so bad that he took an ecstasy pill.  Shortly after the ecstasy he began to have severe abdominal cramping that was worse than the original chronic pain.  Says he has taken these pills before but this is never happened.  Denies any vomiting, diarrhea or blood in stool.  No fever or chills. ? ?Review of Systems  ?Positive: Abdominal pain ?Negative: Nausea vomiting and diarrhea ? ?Physical Exam  ?BP (!) 151/101 (BP Location: Left Arm)   Pulse 97   Temp (!) 97.5 ?F (36.4 ?C) (Oral)   Resp (!) 22   Ht 5\' 8"  (1.727 m)   Wt 65.8 kg   SpO2 99%   BMI 22.05 kg/m?  ?Gen:   Awake, no distress   ?Resp:  Normal effort  ?MSK:   Moves extremities without difficulty  ?Other:  Severe abdominal pain, generalized however worse periumbilically ? ?Medical Decision Making  ?Medically screening exam initiated at 11:32 PM.  Appropriate orders placed.  [name] was informed that the remainder of the evaluation will be completed by another provider, this initial triage assessment does not replace that evaluation, and the importance of remaining in the ED until their evaluation is complete. ? ? ?  ? , PA-C ?07/17/21 2333 ? ?

## 2021-07-17 NOTE — ED Triage Notes (Signed)
Pt reports with severe abdominal pain after bending over to pick something up at work tonight. Pt states that he cannot push his stomach out when he takes a deep breath.  ?

## 2021-07-18 ENCOUNTER — Encounter (HOSPITAL_COMMUNITY): Payer: Self-pay

## 2021-07-18 ENCOUNTER — Emergency Department (HOSPITAL_COMMUNITY): Payer: Medicaid Other | Admitting: Registered Nurse

## 2021-07-18 ENCOUNTER — Emergency Department (HOSPITAL_COMMUNITY): Payer: Medicaid Other

## 2021-07-18 ENCOUNTER — Encounter (HOSPITAL_COMMUNITY): Admission: EM | Disposition: A | Discharge status: Home / Self Care | Payer: Self-pay | Source: Home / Self Care

## 2021-07-18 DIAGNOSIS — R198 Other specified symptoms and signs involving the digestive system and abdomen: Secondary | ICD-10-CM | POA: Diagnosis not present

## 2021-07-18 DIAGNOSIS — Z4682 Encounter for fitting and adjustment of non-vascular catheter: Secondary | ICD-10-CM | POA: Diagnosis not present

## 2021-07-18 DIAGNOSIS — K8020 Calculus of gallbladder without cholecystitis without obstruction: Secondary | ICD-10-CM | POA: Diagnosis not present

## 2021-07-18 DIAGNOSIS — Z888 Allergy status to other drugs, medicaments and biological substances status: Secondary | ICD-10-CM | POA: Diagnosis not present

## 2021-07-18 DIAGNOSIS — F17210 Nicotine dependence, cigarettes, uncomplicated: Secondary | ICD-10-CM | POA: Diagnosis not present

## 2021-07-18 DIAGNOSIS — K271 Acute peptic ulcer, site unspecified, with perforation: Secondary | ICD-10-CM | POA: Diagnosis not present

## 2021-07-18 DIAGNOSIS — K631 Perforation of intestine (nontraumatic): Secondary | ICD-10-CM | POA: Diagnosis not present

## 2021-07-18 DIAGNOSIS — I10 Essential (primary) hypertension: Secondary | ICD-10-CM | POA: Diagnosis not present

## 2021-07-18 DIAGNOSIS — K255 Chronic or unspecified gastric ulcer with perforation: Secondary | ICD-10-CM | POA: Diagnosis not present

## 2021-07-18 DIAGNOSIS — K275 Chronic or unspecified peptic ulcer, site unspecified, with perforation: Secondary | ICD-10-CM

## 2021-07-18 DIAGNOSIS — J189 Pneumonia, unspecified organism: Secondary | ICD-10-CM | POA: Diagnosis not present

## 2021-07-18 DIAGNOSIS — B9681 Helicobacter pylori [H. pylori] as the cause of diseases classified elsewhere: Secondary | ICD-10-CM | POA: Diagnosis not present

## 2021-07-18 DIAGNOSIS — K7689 Other specified diseases of liver: Secondary | ICD-10-CM | POA: Diagnosis not present

## 2021-07-18 HISTORY — PX: LAPAROTOMY: SHX154

## 2021-07-18 LAB — URINALYSIS, ROUTINE W REFLEX MICROSCOPIC
Bilirubin Urine: NEGATIVE
Glucose, UA: NEGATIVE mg/dL
Hgb urine dipstick: NEGATIVE
Ketones, ur: NEGATIVE mg/dL
Leukocytes,Ua: NEGATIVE
Nitrite: NEGATIVE
Protein, ur: NEGATIVE mg/dL
Specific Gravity, Urine: 1.04 — ABNORMAL HIGH (ref 1.005–1.030)
pH: 6 (ref 5.0–8.0)

## 2021-07-18 LAB — CBC
HCT: 32.7 % — ABNORMAL LOW (ref 39.0–52.0)
Hemoglobin: 9.9 g/dL — ABNORMAL LOW (ref 13.0–17.0)
MCH: 23.5 pg — ABNORMAL LOW (ref 26.0–34.0)
MCHC: 30.3 g/dL (ref 30.0–36.0)
MCV: 77.7 fL — ABNORMAL LOW (ref 80.0–100.0)
Platelets: 455 10*3/uL — ABNORMAL HIGH (ref 150–400)
RBC: 4.21 MIL/uL — ABNORMAL LOW (ref 4.22–5.81)
RDW: 21.8 % — ABNORMAL HIGH (ref 11.5–15.5)
WBC: 18.3 10*3/uL — ABNORMAL HIGH (ref 4.0–10.5)
nRBC: 0 % (ref 0.0–0.2)

## 2021-07-18 LAB — CBC WITH DIFFERENTIAL/PLATELET
Abs Immature Granulocytes: 0.04 10*3/uL (ref 0.00–0.07)
Basophils Absolute: 0 10*3/uL (ref 0.0–0.1)
Basophils Relative: 0 %
Eosinophils Absolute: 0.3 10*3/uL (ref 0.0–0.5)
Eosinophils Relative: 2 %
HCT: 34.5 % — ABNORMAL LOW (ref 39.0–52.0)
Hemoglobin: 10.2 g/dL — ABNORMAL LOW (ref 13.0–17.0)
Immature Granulocytes: 0 %
Lymphocytes Relative: 21 %
Lymphs Abs: 2.2 10*3/uL (ref 0.7–4.0)
MCH: 23 pg — ABNORMAL LOW (ref 26.0–34.0)
MCHC: 29.6 g/dL — ABNORMAL LOW (ref 30.0–36.0)
MCV: 77.9 fL — ABNORMAL LOW (ref 80.0–100.0)
Monocytes Absolute: 0.4 10*3/uL (ref 0.1–1.0)
Monocytes Relative: 4 %
Neutro Abs: 7.6 10*3/uL (ref 1.7–7.7)
Neutrophils Relative %: 73 %
Platelets: 510 10*3/uL — ABNORMAL HIGH (ref 150–400)
RBC: 4.43 MIL/uL (ref 4.22–5.81)
RDW: 21.8 % — ABNORMAL HIGH (ref 11.5–15.5)
WBC: 10.5 10*3/uL (ref 4.0–10.5)
nRBC: 0 % (ref 0.0–0.2)

## 2021-07-18 LAB — CREATININE, SERUM
Creatinine, Ser: 1 mg/dL (ref 0.61–1.24)
GFR, Estimated: >60 mL/min (ref >60)

## 2021-07-18 SURGERY — LAPAROTOMY, EXPLORATORY
Anesthesia: General

## 2021-07-18 MED ORDER — MORPHINE SULFATE (PF) 4 MG/ML IV SOLN
6.0000 mg | Freq: Once | INTRAVENOUS | Status: AC
  Administered 2021-07-18: 6 mg via INTRAVENOUS
  Filled 2021-07-18: qty 2

## 2021-07-18 MED ORDER — HYDROMORPHONE HCL 1 MG/ML IJ SOLN
1.0000 mg | Freq: Once | INTRAMUSCULAR | Status: AC
  Administered 2021-07-18: 1 mg via INTRAVENOUS
  Filled 2021-07-18: qty 1

## 2021-07-18 MED ORDER — HYDROMORPHONE HCL 1 MG/ML IJ SOLN
0.2500 mg | INTRAMUSCULAR | Status: DC | PRN
  Administered 2021-07-18: 0.5 mg via INTRAVENOUS

## 2021-07-18 MED ORDER — ROCURONIUM BROMIDE 100 MG/10ML IV SOLN
INTRAVENOUS | Status: DC | PRN
  Administered 2021-07-18: 50 mg via INTRAVENOUS

## 2021-07-18 MED ORDER — LABETALOL HCL 5 MG/ML IV SOLN
INTRAVENOUS | Status: DC | PRN
  Administered 2021-07-18: 2.5 mg via INTRAVENOUS
  Administered 2021-07-18: 5 mg via INTRAVENOUS
  Administered 2021-07-18: 7.5 mg via INTRAVENOUS

## 2021-07-18 MED ORDER — OXYCODONE HCL 5 MG PO TABS: 5.0000 mg | ORAL_TABLET | Freq: Once | ORAL | Status: DC | PRN

## 2021-07-18 MED ORDER — SUCCINYLCHOLINE CHLORIDE 200 MG/10ML IV SOSY
PREFILLED_SYRINGE | INTRAVENOUS | Status: DC | PRN
  Administered 2021-07-18: 120 mg via INTRAVENOUS

## 2021-07-18 MED ORDER — DIPHENHYDRAMINE HCL 12.5 MG/5ML PO ELIX: 12.5000 mg | ORAL_SOLUTION | Freq: Four times a day (QID) | ORAL | Status: DC | PRN

## 2021-07-18 MED ORDER — ACETAMINOPHEN 10 MG/ML IV SOLN
1000.0000 mg | Freq: Four times a day (QID) | INTRAVENOUS | Status: AC
  Administered 2021-07-18 – 2021-07-19 (×4): 1000 mg via INTRAVENOUS
  Filled 2021-07-18 (×4): qty 100

## 2021-07-18 MED ORDER — PROPOFOL 10 MG/ML IV BOLUS
INTRAVENOUS | Status: DC | PRN
  Administered 2021-07-18: 200 mg via INTRAVENOUS

## 2021-07-18 MED ORDER — PHENOL 1.4 % MT LIQD
1.0000 | OROMUCOSAL | Status: DC | PRN
  Filled 2021-07-18: qty 177

## 2021-07-18 MED ORDER — PROPOFOL 10 MG/ML IV BOLUS
INTRAVENOUS | Status: AC
  Filled 2021-07-18: qty 20

## 2021-07-18 MED ORDER — HYDRALAZINE HCL 20 MG/ML IJ SOLN: 10.0000 mg | Freq: Once | INTRAMUSCULAR | Status: AC

## 2021-07-18 MED ORDER — DEXAMETHASONE SODIUM PHOSPHATE 10 MG/ML IJ SOLN
INTRAMUSCULAR | Status: AC
  Filled 2021-07-18: qty 1

## 2021-07-18 MED ORDER — HYDROMORPHONE HCL 2 MG/ML IJ SOLN
INTRAMUSCULAR | Status: AC
  Filled 2021-07-18: qty 1

## 2021-07-18 MED ORDER — KCL-LACTATED RINGERS-D5W 20 MEQ/L IV SOLN
INTRAVENOUS | Status: DC
  Filled 2021-07-18 (×16): qty 1000

## 2021-07-18 MED ORDER — METHOCARBAMOL 500 MG IVPB - SIMPLE MED
INTRAVENOUS | Status: AC
  Filled 2021-07-18: qty 50

## 2021-07-18 MED ORDER — METHOCARBAMOL 500 MG IVPB - SIMPLE MED
500.0000 mg | Freq: Three times a day (TID) | INTRAVENOUS | Status: DC | PRN
  Administered 2021-07-18 – 2021-07-19 (×3): 500 mg via INTRAVENOUS
  Filled 2021-07-18 (×2): qty 500

## 2021-07-18 MED ORDER — ONDANSETRON HCL 4 MG PO TABS: 4.0000 mg | ORAL_TABLET | Freq: Four times a day (QID) | ORAL | Status: DC | PRN

## 2021-07-18 MED ORDER — PIPERACILLIN-TAZOBACTAM 3.375 G IVPB 30 MIN
3.3750 g | Freq: Once | INTRAVENOUS | Status: AC
  Administered 2021-07-18: 3.375 g via INTRAVENOUS
  Filled 2021-07-18: qty 50

## 2021-07-18 MED ORDER — SODIUM CHLORIDE (PF) 0.9 % IJ SOLN
INTRAMUSCULAR | Status: AC
  Filled 2021-07-18: qty 10

## 2021-07-18 MED ORDER — DEXAMETHASONE SODIUM PHOSPHATE 10 MG/ML IJ SOLN
INTRAMUSCULAR | Status: DC | PRN
  Administered 2021-07-18: 7 mg via INTRAVENOUS

## 2021-07-18 MED ORDER — SUFENTANIL CITRATE 50 MCG/ML IV SOLN
INTRAVENOUS | Status: DC | PRN
  Administered 2021-07-18: 5 ug via INTRAVENOUS
  Administered 2021-07-18: 10 ug via INTRAVENOUS

## 2021-07-18 MED ORDER — SUGAMMADEX SODIUM 200 MG/2ML IV SOLN
INTRAVENOUS | Status: DC | PRN
  Administered 2021-07-18: 200 mg via INTRAVENOUS

## 2021-07-18 MED ORDER — ONDANSETRON HCL 4 MG/2ML IJ SOLN
INTRAMUSCULAR | Status: AC
  Filled 2021-07-18: qty 2

## 2021-07-18 MED ORDER — FENTANYL CITRATE (PF) 250 MCG/5ML IJ SOLN
INTRAMUSCULAR | Status: AC
  Filled 2021-07-18: qty 5

## 2021-07-18 MED ORDER — KETAMINE HCL 50 MG/5ML IJ SOSY
15.0000 mg | PREFILLED_SYRINGE | Freq: Once | INTRAMUSCULAR | Status: AC
  Administered 2021-07-18: 15 mg via INTRAVENOUS
  Filled 2021-07-18: qty 5

## 2021-07-18 MED ORDER — DEXMEDETOMIDINE (PRECEDEX) IN NS 20 MCG/5ML (4 MCG/ML) IV SYRINGE
PREFILLED_SYRINGE | INTRAVENOUS | Status: AC
  Filled 2021-07-18: qty 5

## 2021-07-18 MED ORDER — SUFENTANIL CITRATE 50 MCG/ML IV SOLN
INTRAVENOUS | Status: AC
  Filled 2021-07-18: qty 1

## 2021-07-18 MED ORDER — ONDANSETRON HCL 4 MG/2ML IJ SOLN: 4.0000 mg | Freq: Four times a day (QID) | INTRAMUSCULAR | Status: DC | PRN

## 2021-07-18 MED ORDER — MEPERIDINE HCL 50 MG/ML IJ SOLN: 6.2500 mg | INTRAMUSCULAR | Status: DC | PRN

## 2021-07-18 MED ORDER — NALOXONE HCL 0.4 MG/ML IJ SOLN: 0.4000 mg | INTRAMUSCULAR | Status: DC | PRN

## 2021-07-18 MED ORDER — METRONIDAZOLE 500 MG/100ML IV SOLN
500.0000 mg | Freq: Two times a day (BID) | INTRAVENOUS | Status: AC
  Administered 2021-07-18 – 2021-07-22 (×10): 500 mg via INTRAVENOUS
  Filled 2021-07-18 (×10): qty 100

## 2021-07-18 MED ORDER — MIDAZOLAM HCL 5 MG/5ML IJ SOLN
INTRAMUSCULAR | Status: DC | PRN
  Administered 2021-07-18: 2 mg via INTRAVENOUS

## 2021-07-18 MED ORDER — OXYCODONE HCL 5 MG/5ML PO SOLN: 5.0000 mg | Freq: Once | ORAL | Status: DC | PRN

## 2021-07-18 MED ORDER — ONDANSETRON HCL 4 MG/2ML IJ SOLN
INTRAMUSCULAR | Status: DC | PRN
  Administered 2021-07-18: 4 mg via INTRAVENOUS

## 2021-07-18 MED ORDER — HYDROMORPHONE HCL 1 MG/ML IJ SOLN
INTRAMUSCULAR | Status: AC
  Administered 2021-07-18: 0.5 mg via INTRAVENOUS
  Filled 2021-07-18: qty 1

## 2021-07-18 MED ORDER — SODIUM CHLORIDE 0.9 % IV SOLN
2.0000 g | Freq: Every day | INTRAVENOUS | Status: AC
  Administered 2021-07-18 – 2021-07-21 (×4): 2 g via INTRAVENOUS
  Filled 2021-07-18 (×5): qty 20

## 2021-07-18 MED ORDER — MORPHINE SULFATE 1 MG/ML IV SOLN PCA
INTRAVENOUS | Status: DC
  Administered 2021-07-18: 21 mg via INTRAVENOUS
  Administered 2021-07-18: 10 mg via INTRAVENOUS
  Administered 2021-07-18: 7.5 mg via INTRAVENOUS
  Administered 2021-07-18: 10 mg via INTRAVENOUS
  Administered 2021-07-19: 21 mg via INTRAVENOUS
  Administered 2021-07-19: 1.5 mg via INTRAVENOUS
  Administered 2021-07-19: 25.5 mg via INTRAVENOUS
  Administered 2021-07-19: 16.5 mg via INTRAVENOUS
  Administered 2021-07-20: 7.5 mg via INTRAVENOUS
  Administered 2021-07-20: 13.5 mg via INTRAVENOUS
  Administered 2021-07-20: 22.5 mg via INTRAVENOUS
  Administered 2021-07-21: 16.5 mg via INTRAVENOUS
  Administered 2021-07-22: 4.5 mg via INTRAVENOUS
  Administered 2021-07-22: 7.5 mg via INTRAVENOUS
  Administered 2021-07-22: 4.5 mg via INTRAVENOUS
  Administered 2021-07-22: 10.5 mg via INTRAVENOUS
  Administered 2021-07-22: 0.79 mg via INTRAVENOUS
  Administered 2021-07-22: 10.5 mg via INTRAVENOUS
  Administered 2021-07-22: 4.5 mg via INTRAVENOUS
  Administered 2021-07-23: 11.16 mg via INTRAVENOUS
  Administered 2021-07-23: 12 mg via INTRAVENOUS
  Administered 2021-07-23: 9 mg via INTRAVENOUS
  Filled 2021-07-18 (×14): qty 30

## 2021-07-18 MED ORDER — SODIUM CHLORIDE 0.9% FLUSH: 9.0000 mL | INTRAVENOUS | Status: DC | PRN

## 2021-07-18 MED ORDER — DIPHENHYDRAMINE HCL 50 MG/ML IJ SOLN: 12.5000 mg | Freq: Four times a day (QID) | INTRAMUSCULAR | Status: DC | PRN

## 2021-07-18 MED ORDER — PROCHLORPERAZINE MALEATE 10 MG PO TABS
10.0000 mg | ORAL_TABLET | Freq: Four times a day (QID) | ORAL | Status: DC | PRN
  Filled 2021-07-18: qty 1

## 2021-07-18 MED ORDER — HYDROMORPHONE HCL 1 MG/ML IJ SOLN
INTRAMUSCULAR | Status: DC | PRN
Start: 2021-07-18 — End: 2021-07-18
  Administered 2021-07-18 (×2): 1 mg via INTRAVENOUS

## 2021-07-18 MED ORDER — DEXMEDETOMIDINE (PRECEDEX) IN NS 20 MCG/5ML (4 MCG/ML) IV SYRINGE
PREFILLED_SYRINGE | INTRAVENOUS | Status: DC | PRN
  Administered 2021-07-18: 8 ug via INTRAVENOUS
  Administered 2021-07-18: 4 ug via INTRAVENOUS

## 2021-07-18 MED ORDER — PANTOPRAZOLE 80MG IVPB - SIMPLE MED
80.0000 mg | Freq: Once | INTRAVENOUS | Status: AC
  Administered 2021-07-18: 80 mg via INTRAVENOUS
  Filled 2021-07-18: qty 80

## 2021-07-18 MED ORDER — LABETALOL HCL 5 MG/ML IV SOLN
5.0000 mg | INTRAVENOUS | Status: DC | PRN
  Filled 2021-07-18: qty 4

## 2021-07-18 MED ORDER — LIDOCAINE HCL (CARDIAC) PF 100 MG/5ML IV SOSY
PREFILLED_SYRINGE | INTRAVENOUS | Status: DC | PRN
  Administered 2021-07-18: 75 mg via INTRAVENOUS

## 2021-07-18 MED ORDER — 0.9 % SODIUM CHLORIDE (POUR BTL) OPTIME
TOPICAL | Status: DC | PRN
  Administered 2021-07-18: 6000 mL

## 2021-07-18 MED ORDER — DIPHENHYDRAMINE HCL 50 MG/ML IJ SOLN
25.0000 mg | Freq: Once | INTRAMUSCULAR | Status: AC
  Administered 2021-07-18: 25 mg via INTRAVENOUS
  Filled 2021-07-18: qty 1

## 2021-07-18 MED ORDER — FENTANYL CITRATE (PF) 100 MCG/2ML IJ SOLN
INTRAMUSCULAR | Status: DC | PRN
  Administered 2021-07-18 (×2): 100 ug via INTRAVENOUS
  Administered 2021-07-18: 50 ug via INTRAVENOUS

## 2021-07-18 MED ORDER — PROMETHAZINE HCL 25 MG/ML IJ SOLN: 6.2500 mg | INTRAMUSCULAR | Status: DC | PRN

## 2021-07-18 MED ORDER — AMISULPRIDE (ANTIEMETIC) 5 MG/2ML IV SOLN: 10.0000 mg | Freq: Once | INTRAVENOUS | Status: DC | PRN

## 2021-07-18 MED ORDER — IOHEXOL 300 MG/ML  SOLN
100.0000 mL | Freq: Once | INTRAMUSCULAR | Status: AC | PRN
  Administered 2021-07-18: 100 mL via INTRAVENOUS

## 2021-07-18 MED ORDER — PANTOPRAZOLE INFUSION (NEW) - SIMPLE MED
8.0000 mg/h | INTRAVENOUS | Status: AC
  Administered 2021-07-18 – 2021-07-21 (×6): 8 mg/h via INTRAVENOUS
  Filled 2021-07-18 (×3): qty 80
  Filled 2021-07-18: qty 100
  Filled 2021-07-18 (×2): qty 80
  Filled 2021-07-18: qty 100
  Filled 2021-07-18: qty 80
  Filled 2021-07-18 (×2): qty 100

## 2021-07-18 MED ORDER — MIDAZOLAM HCL 2 MG/2ML IJ SOLN
INTRAMUSCULAR | Status: AC
  Filled 2021-07-18: qty 2

## 2021-07-18 MED ORDER — PROPOFOL 500 MG/50ML IV EMUL
INTRAVENOUS | Status: AC
  Filled 2021-07-18: qty 50

## 2021-07-18 MED ORDER — LACTATED RINGERS IV SOLN: INTRAVENOUS | Status: DC | PRN

## 2021-07-18 MED ORDER — PROCHLORPERAZINE EDISYLATE 10 MG/2ML IJ SOLN: 5.0000 mg | Freq: Four times a day (QID) | INTRAMUSCULAR | Status: DC | PRN

## 2021-07-18 MED ORDER — ENOXAPARIN SODIUM 40 MG/0.4ML IJ SOSY
40.0000 mg | PREFILLED_SYRINGE | INTRAMUSCULAR | Status: DC
  Administered 2021-07-19 – 2021-07-24 (×6): 40 mg via SUBCUTANEOUS
  Filled 2021-07-18 (×6): qty 0.4

## 2021-07-18 MED ORDER — PANTOPRAZOLE SODIUM 40 MG IV SOLR
40.0000 mg | Freq: Two times a day (BID) | INTRAVENOUS | Status: DC
  Administered 2021-07-21 – 2021-07-22 (×3): 40 mg via INTRAVENOUS
  Filled 2021-07-18 (×3): qty 10

## 2021-07-18 MED ORDER — HYDRALAZINE HCL 20 MG/ML IJ SOLN
INTRAMUSCULAR | Status: AC
  Administered 2021-07-18: 10 mg via INTRAVENOUS
  Filled 2021-07-18: qty 1

## 2021-07-18 MED ORDER — DROPERIDOL 2.5 MG/ML IJ SOLN
1.2500 mg | Freq: Once | INTRAMUSCULAR | Status: AC
  Administered 2021-07-18: 1.25 mg via INTRAVENOUS
  Filled 2021-07-18: qty 2

## 2021-07-18 SURGICAL SUPPLY — 42 items
BLADE EXTENDED COATED 6.5IN (ELECTRODE) ×1 IMPLANT
BLADE HEX COATED 2.75 (ELECTRODE) ×3 IMPLANT
BLADE SURG SZ10 CARB STEEL (BLADE) IMPLANT
COVER MAYO STAND STRL (DRAPES) ×3 IMPLANT
COVER SURGICAL LIGHT HANDLE (MISCELLANEOUS) ×3 IMPLANT
DRAIN CHANNEL 19F RND (DRAIN) IMPLANT
DRAPE LAPAROSCOPIC ABDOMINAL (DRAPES) ×3 IMPLANT
DRAPE SHEET LG 3/4 BI-LAMINATE (DRAPES) ×2 IMPLANT
DRAPE WARM FLUID 44X44 (DRAPES) ×3 IMPLANT
DRSG KUZMA FLUFF (GAUZE/BANDAGES/DRESSINGS) ×1 IMPLANT
DRSG PAD ABDOMINAL 8X10 ST (GAUZE/BANDAGES/DRESSINGS) ×2 IMPLANT
ELECT REM PT RETURN 15FT ADLT (MISCELLANEOUS) ×3 IMPLANT
EVACUATOR DRAINAGE 10X20 100CC (DRAIN) IMPLANT
EVACUATOR SILICONE 100CC (DRAIN)
GAUZE SPONGE 4X4 12PLY STRL (GAUZE/BANDAGES/DRESSINGS) ×2 IMPLANT
GLOVE BIO SURGEON STRL SZ 6 (GLOVE) ×7 IMPLANT
GLOVE BIO SURGEON STRL SZ8 (GLOVE) ×2 IMPLANT
GLOVE BIOGEL PI IND STRL 6.5 (GLOVE) ×2 IMPLANT
GLOVE BIOGEL PI IND STRL 7.0 (GLOVE) ×2 IMPLANT
GLOVE BIOGEL PI IND STRL 8 (GLOVE) IMPLANT
GLOVE BIOGEL PI INDICATOR 6.5 (GLOVE) ×1
GLOVE BIOGEL PI INDICATOR 7.0 (GLOVE)
GLOVE BIOGEL PI INDICATOR 8 (GLOVE) ×2
GLOVE INDICATOR 6.5 STRL GRN (GLOVE) ×5 IMPLANT
GOWN STRL REUS W/TWL 2XL LVL3 (GOWN DISPOSABLE) ×4 IMPLANT
HANDLE SUCTION POOLE (INSTRUMENTS) ×2 IMPLANT
KIT BASIN OR (CUSTOM PROCEDURE TRAY) ×3 IMPLANT
KIT TURNOVER KIT A (KITS) ×1 IMPLANT
LEGGING LITHOTOMY PAIR STRL (DRAPES) IMPLANT
PACK GENERAL/GYN (CUSTOM PROCEDURE TRAY) ×3 IMPLANT
STAPLER VISISTAT 35W (STAPLE) ×2 IMPLANT
SUCTION POOLE HANDLE (INSTRUMENTS) ×2
SUT PDS AB 1 CTX 36 (SUTURE) IMPLANT
SUT PDS AB 1 TP1 96 (SUTURE) ×2 IMPLANT
SUT SILK 2 0 SH CR/8 (SUTURE) ×2 IMPLANT
SUT SILK 3 0 SH CR/8 (SUTURE) ×1 IMPLANT
SUT VIC AB 2-0 SH 18 (SUTURE) ×2 IMPLANT
SUT VIC AB 3-0 SH 18 (SUTURE) ×2 IMPLANT
TAPE CLOTH SURG 4X10 WHT LF (GAUZE/BANDAGES/DRESSINGS) ×1 IMPLANT
TOWEL OR 17X26 10 PK STRL BLUE (TOWEL DISPOSABLE) ×5 IMPLANT
TRAY FOLEY MTR SLVR 16FR STAT (SET/KITS/TRAYS/PACK) ×3 IMPLANT
YANKAUER SUCT BULB TIP NO VENT (SUCTIONS) ×3 IMPLANT

## 2021-07-18 NOTE — Progress Notes (Signed)
Transition of Care (TOC) Screening Note ? ?Patient Details  ?Name: [name] ?Date of Birth: [birth year] ? ?Transition of Care (TOC) CM/SW Contact:    ?Ewing Schlein, LCSW ?Phone Number: ?07/18/2021, 11:24 AM ? ?Transition of Care Department [facility]) has reviewed patient and no TOC needs have been identified at this time. We will continue to monitor patient advancement through interdisciplinary progression rounds. If new patient transition needs arise, please place a TOC consult. ?

## 2021-07-18 NOTE — Anesthesia Postprocedure Evaluation (Signed)
Anesthesia Post Note ? ?Patient: [name] ? ?Procedure(s) Performed: EXPLORATORY LAPAROTOMY WITH GRAHAM PATCH ? ?  ? ?Patient location during evaluation: PACU ?Anesthesia Type: General ?Level of consciousness: awake and alert ?Pain management: pain level controlled ?Vital Signs Assessment: post-procedure vital signs reviewed and stable ?Respiratory status: spontaneous breathing, nonlabored ventilation and respiratory function stable ?Cardiovascular status: blood pressure returned to baseline and stable ?Postop Assessment: no apparent nausea or vomiting ?Anesthetic complications: no ? ? ?No notable events documented. ? ?Last Vitals:  ?Vitals:  ? 07/18/21 0550 07/18/21 0600  ?BP: (!) 159/105 (!) 159/101  ?Pulse: 83 85  ?Resp: 19 (!) 24  ?Temp: 36.6 ?C   ?SpO2: 100% 100%  ?  ?Last Pain:  ?Vitals:  ? 07/18/21 0600  ?TempSrc:   ?PainSc: Asleep  ? ? ?  ?  ?  ?  ?  ?  ? ?Lowella Curb ? ? ? ? ?

## 2021-07-18 NOTE — Op Note (Signed)
PRE-OPERATIVE DIAGNOSIS: perforated viscus ? ?POST-OPERATIVE DIAGNOSIS:  Same ? ?PROCEDURE:  Procedure(s): ?Exploratory laparotomy, graham patch repair of perforated peptic ulcer ? ?SURGEON:  Surgeon(s): ?Stark Klein, MD ? ?ANESTHESIA:   general ? ?DRAINS: Nasogastric Tube  ? ?LOCAL MEDICATIONS USED:  NONE ? ?SPECIMEN:  No Specimen ? ?DISPOSITION OF SPECIMEN:  N/A ? ?COUNTS:  YES ? ?DICTATION: .Dragon Dictation ? ?PLAN OF CARE: Admit to inpatient  ? ?PATIENT DISPOSITION:  PACU - hemodynamically stable. ? ?FINDINGS:  perforated peptic ulcer pyloric channel lesser curve, NGT position confirmed manually in stomach.   ? ?CASE DATA: ? ?Type of patient?: LDOW CASE (Surgical Hospitalist [facility] Inpatient) ? ?Status of Case? EMERGENT Add On ? ?Infection Present At Time Of Surgery (PATOS)?  Diffuse peritonitis ? ? ?EBL: min ? ?PROCEDURE:  ?Patient was identified in the holding area and taken the operating room where he was placed supine on the operating room table.  Sequential compression devices were placed.  General endotracheal anesthesia was induced.  Foley catheter was placed.  Active warming was placed.  His abdomen was then prepped and draped in sterile fashion.  Timeout was performed according to the surgical safety checklist.  When all was correct, we continued. ? ?An upper midline incision was made based on the patient's symptoms and the findings.  There was significant evidence of gastric contents throughout the abdomen.  This was suctioned.  There was a perforated ulcer visible on the lesser curve just proximal to the pylorus.  This was a relatively large defect.  A portion of omentum was freed up.  Interrupted silk sutures were then used to gently secure the omental patch over the ulcer. Because the patient was very thin and had thin omentum, a second portion of omentum was pulled up as there was still leakage proximally from the ulcer defect.  Additional sutures were placed to secure the omentum. This corrected the  defect.   ? ?The NGT was confirmed in the stomach.  The abdomen was then copiously irrigated until clear. ? ?The fascia was then closed with running #1 looped PDS suture.  The skin was irrigated and left open to heal by secondary intention.  This closed with a dry sterile dressing. ? ?Patient was allowed to emerge from anesthesia and was taken to the PACU in stable condition.  Needle, sponge, and instrument counts were correct x2. ? ? ? ?  ? ?

## 2021-07-18 NOTE — Anesthesia Preprocedure Evaluation (Signed)
Anesthesia Evaluation  ?Patient identified by MRN, date of birth, ID band ?Patient awake ? ? ? ?Reviewed: ?Allergy & Precautions, NPO status , Patient's Chart, lab work & pertinent test results ? ?Airway ?Mallampati: II ? ?TM Distance: >3 FB ?Neck ROM: Full ? ? ? Dental ?no notable dental hx. ? ?  ?Pulmonary ?neg pulmonary ROS, Current Smoker and Patient abstained from smoking.,  ?  ?Pulmonary exam normal ?breath sounds clear to auscultation ? ? ? ? ? ? Cardiovascular ?negative cardio ROS ?Normal cardiovascular exam ?Rhythm:Regular Rate:Normal ? ? ?  ?Neuro/Psych ? Headaches, negative psych ROS  ? GI/Hepatic ?negative GI ROS, Neg liver ROS,   ?Endo/Other  ?negative endocrine ROS ? Renal/GU ?negative Renal ROS  ?negative genitourinary ?  ?Musculoskeletal ?negative musculoskeletal ROS ?(+)  ? Abdominal ?  ?Peds ?negative pediatric ROS ?(+)  Hematology ?negative hematology ROS ?(+)   ?Anesthesia Other Findings ? ? Reproductive/Obstetrics ?negative OB ROS ? ?  ? ? ? ? ? ? ? ? ? ? ? ? ? ?  ?  ? ? ? ? ? ? ? ? ?Anesthesia Physical ?Anesthesia Plan ? ?ASA: 3 and emergent ? ?Anesthesia Plan: General  ? ?Post-op Pain Management: Dilaudid IV and Ofirmev IV (intra-op)*  ? ?Induction: Intravenous, Rapid sequence and Cricoid pressure planned ? ?PONV Risk Score and Plan: 1 and Ondansetron and Treatment may vary due to age or medical condition ? ?Airway Management Planned: Oral ETT ? ?Additional Equipment:  ? ?Intra-op Plan:  ? ?Post-operative Plan: Extubation in OR and Possible Post-op intubation/ventilation ? ?Informed Consent: I have reviewed the patients History and Physical, chart, labs and discussed the procedure including the risks, benefits and alternatives for the proposed anesthesia with the patient or authorized representative who has indicated his/her understanding and acceptance.  ? ? ? ?Dental advisory given ? ?Plan Discussed with: CRNA ? ?Anesthesia Plan Comments:    ? ? ? ? ? ? ?Anesthesia Quick Evaluation ? ?

## 2021-07-18 NOTE — H&P (Addendum)
?[name] is an 41 y.o. male.   ?Chief Complaint: abdominal pain ?HPI:  ?Pt is a 41 yo M who has been having abdominal pain on and off for several months.  He came to the ED around 6-7 weeks ago with similar mid abdominal pain.  CT at that time was negative.  This time the pain came on when he leaned over but was very severe.  He came to the ED this time.  He denies fever/chills.  He denies n/v.  He was going to see GI but had issues arranging transportation.  ? ?He does have a history of ongoing tobacco use and cocaine use in the last few days.  .  ? ?Past Medical History:  ?Diagnosis Date  ? Mental disorder   ? Migraine   ? Ulcer   ? ? ?History reviewed. No pertinent surgical history. ? ?History reviewed. No pertinent family history. ?Social History:  reports that he has been smoking. He has been smoking an average of .5 packs per day. He has never used smokeless tobacco. He reports current drug use. Drug: Marijuana. He reports that he does not drink alcohol. ? ?Allergies: No Known Allergies ? ?Medications:   ?None ? ?Results for orders placed or performed during the hospital encounter of 07/17/21 (from the past 48 hour(s))  ?Comprehensive metabolic panel     Status: Abnormal  ? Collection Time: 07/17/21 11:30 PM  ?Result Value Ref Range  ? Sodium 137 135 - 145 mmol/L  ? Potassium 3.6 3.5 - 5.1 mmol/L  ? Chloride 100 98 - 111 mmol/L  ? CO2 30 22 - 32 mmol/L  ? Glucose, Bld 122 (H) 70 - 99 mg/dL  ?  Comment: Glucose reference range applies only to samples taken after fasting for at least 8 hours.  ? BUN 11 6 - 20 mg/dL  ? Creatinine, Ser 1.11 0.61 - 1.24 mg/dL  ? Calcium 9.8 8.9 - 10.3 mg/dL  ? Total Protein 8.0 6.5 - 8.1 g/dL  ? Albumin 3.4 (L) 3.5 - 5.0 g/dL  ? AST 11 (L) 15 - 41 U/L  ? ALT 9 0 - 44 U/L  ? Alkaline Phosphatase 86 38 - 126 U/L  ? Total Bilirubin 0.4 0.3 - 1.2 mg/dL  ? GFR, Estimated >60 >60 mL/min  ?  Comment: (NOTE) ?Calculated using the CKD-EPI Creatinine Equation (2021) ?  ? Anion gap 7 5 -  15  ?  Comment: Performed at [facility], [address], [place], [place] [address]  ?Lipase, blood     Status: None  ? Collection Time: 07/17/21 11:30 PM  ?Result Value Ref Range  ? Lipase 25 11 - 51 U/L  ?  Comment: Performed at [facility], [address] [place], [place] [address]  ?CBC with Diff     Status: Abnormal  ? Collection Time: 07/17/21 11:30 PM  ?Result Value Ref Range  ? WBC 10.5 4.0 - 10.5 K/uL  ? RBC 4.43 4.22 - 5.81 MIL/uL  ? Hemoglobin 10.2 (L) 13.0 - 17.0 g/dL  ? HCT 34.5 (L) 39.0 - 52.0 %  ? MCV 77.9 (L) 80.0 - 100.0 fL  ? MCH 23.0 (L) 26.0 - 34.0 pg  ? MCHC 29.6 (L) 30.0 - 36.0 g/dL  ? RDW 21.8 (H) 11.5 - 15.5 %  ? Platelets 510 (H) 150 - 400 K/uL  ? nRBC 0.0 0.0 - 0.2 %  ? Neutrophils Relative % 73 %  ? Neutro Abs 7.6  1.7 - 7.7 K/uL  ? Lymphocytes Relative 21 %  ? Lymphs Abs 2.2 0.7 - 4.0 K/uL  ? Monocytes Relative 4 %  ? Monocytes Absolute 0.4 0.1 - 1.0 K/uL  ? Eosinophils Relative 2 %  ? Eosinophils Absolute 0.3 0.0 - 0.5 K/uL  ? Basophils Relative 0 %  ? Basophils Absolute 0.0 0.0 - 0.1 K/uL  ? Immature Granulocytes 0 %  ? Abs Immature Granulocytes 0.04 0.00 - 0.07 K/uL  ? Dohle Bodies PRESENT   ? Tear Drop Cells PRESENT   ? Polychromasia PRESENT   ? Target Cells PRESENT   ?  Comment: Performed at [facility], [address], [place], [place] [address]  ?Urinalysis, Routine w reflex microscopic Urine, Clean Catch     Status: Abnormal  ? Collection Time: 07/18/21  2:30 AM  ?Result Value Ref Range  ? Color, Urine YELLOW YELLOW  ? APPearance CLEAR CLEAR  ? Specific Gravity, Urine 1.040 (H) 1.005 - 1.030  ? pH 6.0 5.0 - 8.0  ? Glucose, UA NEGATIVE NEGATIVE mg/dL  ? Hgb urine dipstick NEGATIVE NEGATIVE  ? Bilirubin Urine NEGATIVE NEGATIVE  ? Ketones, ur NEGATIVE NEGATIVE mg/dL  ? Protein, ur NEGATIVE NEGATIVE mg/dL  ? Nitrite NEGATIVE NEGATIVE  ? Leukocytes,Ua NEGATIVE NEGATIVE  ?  Comment: Performed at [facility],  [address], [place], [place] [address]  ? ?CT ABDOMEN PELVIS W CONTRAST ? ?Result Date: 07/18/2021 ?CLINICAL DATA:  Severe abdominal pain EXAM: CT ABDOMEN AND PELVIS WITH CONTRAST TECHNIQUE: Multidetector CT imaging of the abdomen and pelvis was performed using the standard protocol following bolus administration of intravenous contrast. RADIATION DOSE REDUCTION: This exam was performed according to the departmental dose-optimization program which includes automated exposure control, adjustment of the mA and/or kV according to patient size and/or use of iterative reconstruction technique. CONTRAST:  OMNIPAQUE IOHEXOL 300 MG/ML  SOLN COMPARISON:  CT 05/27/2021 FINDINGS: Lower chest: Lung bases demonstrate no acute consolidation or effusion. Normal cardiac size Hepatobiliary: Central hepatic cyst, no follow-up imaging is recommended. No gallstones, gallbladder wall thickening, or biliary dilatation. Pancreas: Unremarkable. No pancreatic ductal dilatation or surrounding inflammatory changes. Spleen: Normal in size without focal abnormality. Adrenals/Urinary Tract: Adrenal glands are normal. Kidneys show no hydronephrosis. The bladder is unremarkable. Stomach/Bowel: Moderate fluid distension of the stomach. Wall thickening of the pyloric region of the stomach with possible defect, coronal series 4, image 34. Nondilated small bowel. Slightly thickened appearing lower abdominal small bowel loops possibly reactive. Negative appendix Vascular/Lymphatic: No significant vascular findings are present. No enlarged abdominal or pelvic lymph nodes. Reproductive: Prostate is unremarkable. Other: Moderate volume free air in the upper abdomen. Moderate free fluid within the abdomen and pelvis. Musculoskeletal: No acute or significant osseous findings. IMPRESSION: 1. Moderate volume free fluid and free air consistent with hollow viscus perforation. Suspected source is suspected to be secondary to perforated gastric ulcer as  there is marked wall thickening of the pyloric region of the stomach with potential defect visible on coronal reconstructions. Critical Value/emergent results were called by telephone at the time of interpretation on 07/18/2021 at 1:10 am to provider Vena Austria of the ED, who verbally acknowledged these results. Electronically Signed   By: Jasmine Pang M.D.   On: 07/18/2021 01:11   ? ?Review of Systems  ?Gastrointestinal:  Positive for abdominal pain.  ?All other systems reviewed and are negative. ? ?Blood pressure (!) 161/105, pulse (!) 109, temperature (!) 97.5 ?F (36.4 ?C), temperature source  Oral, resp. rate (!) 38, height  (1.727 m), weight 65.8 kg, SpO2 98 %. ?Physical Exam ?Vitals reviewed.  ?Constitutional:   ?   General: He is in acute distress.  ?   Appearance: He is ill-appearing and toxic-appearing.  ?   Comments: Very thin  ?HENT:  ?   Head: Normocephalic and atraumatic.  ?Cardiovascular:  ?   Rate and Rhythm: Tachycardia present.  ?   Heart sounds: Normal heart sounds. No murmur heard. ?  No friction rub. No gallop.  ?Pulmonary:  ?   Effort: Pulmonary effort is normal. No respiratory distress.  ?   Breath sounds: Normal breath sounds. No stridor. No wheezing or rhonchi.  ?Chest:  ?   Chest wall: No tenderness.  ?Abdominal:  ?   General: Bowel sounds are absent. There is no distension.  ?   Palpations: Abdomen is rigid. There is no shifting dullness, hepatomegaly, splenomegaly or mass.  ?   Tenderness: There is generalized abdominal tenderness. There is guarding and rebound.  ?Skin: ?   General: Skin is warm and dry.  ?   Capillary Refill: Capillary refill takes 2 to 3 seconds.  ?   Comments: Numerous tattoos, few on abdominal wall  ?Neurological:  ?   General: No focal deficit present.  ?   Mental Status: He is alert and oriented to person, place, and time.  ?Psychiatric:     ?   Mood and Affect: Mood is anxious. Mood is not depressed.     ?   Behavior: Behavior normal.  ?   ? ?Assessment/Plan ?Perforated viscus ?SIRS ?Substance abuse ? ?Likely perforated ulcer based on symptoms and scan. ?However, I reviewed risk of perforated bowel as well. ? ?Plan ex lap.  Would do graham patch repair

## 2021-07-18 NOTE — Transfer of Care (Signed)
Immediate Anesthesia Transfer of Care Note ? ?Patient: [name] ? ?Procedure(s) Performed: EXPLORATORY LAPAROTOMY WITH GRAHAM PATCH ? ?Patient Location: PACU ? ?Anesthesia Type:General ? ?Level of Consciousness: awake, alert  and patient cooperative ? ?Airway & Oxygen Therapy: Patient Spontanous Breathing and Patient connected to face mask oxygen ? ?Post-op Assessment: Report given to RN, Post -op Vital signs reviewed and stable and Patient moving all extremities X 4 ? ?Post vital signs: stable ? ?Last Vitals:  ?Vitals Value Taken Time  ?BP 159/105 07/18/21 0550  ?Temp    ?Pulse 84 07/18/21 0556  ?Resp 29 07/18/21 0556  ?SpO2 100 % 07/18/21 0556  ?Vitals shown include unvalidated device data. ? ?Last Pain:  ?Vitals:  ? 07/18/21 0322  ?TempSrc:   ?PainSc: 10-Worst pain ever  ?   ? ?  ? ?Complications: No notable events documented. ?

## 2021-07-18 NOTE — ED Notes (Signed)
Tyrone Nine, MD notified of radiologist report of perforation ?

## 2021-07-18 NOTE — Anesthesia Procedure Notes (Signed)
Procedure Name: Intubation ?Date/Time: 07/18/2021 4:27 AM ?Performed by: Lissa Morales, CRNA ?Pre-anesthesia Checklist: Patient identified, Emergency Drugs available, Suction available and Patient being monitored ?Patient Re-evaluated:Patient Re-evaluated prior to induction ?Oxygen Delivery Method: Circle system utilized ?Preoxygenation: Pre-oxygenation with 100% oxygen ?Induction Type: IV induction, Rapid sequence and Cricoid Pressure applied ?Laryngoscope Size: Mac and 4 ?Grade View: Grade II ?Tube type: Oral ?Tube size: 7.5 mm ?Number of attempts: 1 ?Airway Equipment and Method: Stylet and Oral airway ?Placement Confirmation: ETT inserted through vocal cords under direct vision, positive ETCO2 and breath sounds checked- equal and bilateral ?Secured at: 23 cm ?Tube secured with: Tape ?Dental Injury: Teeth and Oropharynx as per pre-operative assessment  ? ? ? ? ?

## 2021-07-18 NOTE — ED Notes (Signed)
Patient transported to CT 

## 2021-07-18 NOTE — ED Provider Notes (Signed)
?[facility]-EMERGENCY DEPT ?Provider Note ? ? ?CSN: [id number] ?Arrival date & time: 07/17/21  2316 ? ?  ? ?History ? ?Chief Complaint  ?Patient presents with  ? Abdominal Pain  ? ? ?[name] is a 41 y.o. male. ? ?41 yo M with a chief complaints of abdominal discomfort.  This happened at work he said he bent over and had sudden severe discomfort.  No nausea no vomiting.  Something like this happened similarly about a month ago.  Had a CT scan at that time that was normal.  He had planned follow-up with a GI specialist but was unable to get transportation to be seen. ? ? ?Abdominal Pain ? ?  ? ?Home Medications ?Prior to Admission medications   ?Medication Sig Start Date End Date Taking? Authorizing Provider  ?dicyclomine (BENTYL) 20 MG tablet Take 1 tablet (20 mg total) by mouth every 8 (eight) hours as needed (Abdominal cramping). 05/27/21   Mancel Bale, MD  ?ferrous sulfate 325 (65 FE) MG tablet Take 1 tablet (325 mg total) by mouth 2 (two) times daily with a meal. 05/27/21   Mancel Bale, MD  ?metoCLOPramide (REGLAN) 10 MG tablet Take 1 tablet (10 mg total) by mouth every 8 (eight) hours as needed for nausea (or headache). 10/14/18   Aviva Kluver B, PA-C  ?citalopram (CELEXA) 20 MG tablet Take 1 tablet (20 mg total) by mouth daily. For depression/anxiety ?Patient not taking: Reported on 04/11/2016 02/17/13 10/14/18  Thermon Leyland, NP  ?SUMAtriptan (IMITREX) 100 MG tablet Take 1 tablet (100 mg total) by mouth every 2 (two) hours as needed for migraine or headache. May repeat in 2 hours if headache persists or recurs. ?Patient not taking: Reported on 04/11/2016 01/06/14 10/14/18  Gilda Crease, MD  ?   ? ?Allergies    ?Patient has no known allergies.   ? ?Review of Systems   ?Review of Systems  ?Gastrointestinal:  Positive for abdominal pain.  ? ?Physical Exam ?Updated Vital Signs ?BP (!) 178/115   Pulse (!) 106   Temp (!) 97.5 ?F (36.4 ?C) (Oral)   Resp (!) 28   Ht 5\' 8"  (1.727 m)    Wt 65.8 kg   SpO2 98%   BMI 22.05 kg/m?  ?Physical Exam ?Vitals and nursing note reviewed.  ?Constitutional:   ?   Appearance: He is well-developed.  ?HENT:  ?   Head: Normocephalic and atraumatic.  ?Eyes:  ?   Pupils: Pupils are equal, round, and reactive to light.  ?Neck:  ?   Vascular: No JVD.  ?Cardiovascular:  ?   Rate and Rhythm: Normal rate and regular rhythm.  ?   Heart sounds: No murmur heard. ?  No friction rub. No gallop.  ?Pulmonary:  ?   Effort: No respiratory distress.  ?   Breath sounds: No wheezing.  ?Abdominal:  ?   General: There is no distension.  ?   Tenderness: There is no abdominal tenderness. There is no guarding or rebound.  ?   Comments: Patient is writhing on the bed.  No obvious focal abdominal discomfort.  ?Musculoskeletal:     ?   General: Normal range of motion.  ?   Cervical back: Normal range of motion and neck supple.  ?Skin: ?   Coloration: Skin is not pale.  ?   Findings: No rash.  ?Neurological:  ?   Mental Status: He is alert and oriented to person, place, and time.  ?Psychiatric:     ?  Behavior: Behavior normal.  ? ? ?ED Results / Procedures / Treatments   ?Labs ?(all labs ordered are listed, but only abnormal results are displayed) ?Labs Reviewed  ?COMPREHENSIVE METABOLIC PANEL - Abnormal; Notable for the following components:  ?    Result Value  ? Glucose, Bld 122 (*)   ? Albumin 3.4 (*)   ? AST 11 (*)   ? All other components within normal limits  ?CBC WITH DIFFERENTIAL/PLATELET - Abnormal; Notable for the following components:  ? Hemoglobin 10.2 (*)   ? HCT 34.5 (*)   ? MCV 77.9 (*)   ? MCH 23.0 (*)   ? MCHC 29.6 (*)   ? RDW 21.8 (*)   ? Platelets 510 (*)   ? All other components within normal limits  ?LIPASE, BLOOD  ?URINALYSIS, ROUTINE W REFLEX MICROSCOPIC  ? ? ?EKG ?None ? ?Radiology ?CT ABDOMEN PELVIS W CONTRAST ? ?Result Date: 07/18/2021 ?CLINICAL DATA:  Severe abdominal pain EXAM: CT ABDOMEN AND PELVIS WITH CONTRAST TECHNIQUE: Multidetector CT imaging of the  abdomen and pelvis was performed using the standard protocol following bolus administration of intravenous contrast. RADIATION DOSE REDUCTION: This exam was performed according to the departmental dose-optimization program which includes automated exposure control, adjustment of the mA and/or kV according to patient size and/or use of iterative reconstruction technique. CONTRAST:  100mL OMNIPAQUE IOHEXOL 300 MG/ML  SOLN COMPARISON:  CT 05/27/2021 FINDINGS: Lower chest: Lung bases demonstrate no acute consolidation or effusion. Normal cardiac size Hepatobiliary: Central hepatic cyst, no follow-up imaging is recommended. No gallstones, gallbladder wall thickening, or biliary dilatation. Pancreas: Unremarkable. No pancreatic ductal dilatation or surrounding inflammatory changes. Spleen: Normal in size without focal abnormality. Adrenals/Urinary Tract: Adrenal glands are normal. Kidneys show no hydronephrosis. The bladder is unremarkable. Stomach/Bowel: Moderate fluid distension of the stomach. Wall thickening of the pyloric region of the stomach with possible defect, coronal series 4, image 34. Nondilated small bowel. Slightly thickened appearing lower abdominal small bowel loops possibly reactive. Negative appendix Vascular/Lymphatic: No significant vascular findings are present. No enlarged abdominal or pelvic lymph nodes. Reproductive: Prostate is unremarkable. Other: Moderate volume free air in the upper abdomen. Moderate free fluid within the abdomen and pelvis. Musculoskeletal: No acute or significant osseous findings. IMPRESSION: 1. Moderate volume free fluid and free air consistent with hollow viscus perforation. Suspected source is suspected to be secondary to perforated gastric ulcer as there is marked wall thickening of the pyloric region of the stomach with potential defect visible on coronal reconstructions. Critical Value/emergent results were called by telephone at the time of interpretation on 07/18/2021  at 1:10 am to provider Vena AustriaJulie Whitley of the ED, who verbally acknowledged these results. Electronically Signed   By: Jasmine PangKim  Fujinaga M.D.   On: 07/18/2021 01:11   ? ?Procedures ?Procedures  ? ? ?Medications Ordered in ED ?Medications  ?morphine (PF) 2 MG/ML injection 2 mg (2 mg Intravenous Given 07/17/21 2343)  ?droperidol (INAPSINE) 2.5 MG/ML injection 1.25 mg (1.25 mg Intravenous Given 07/18/21 0021)  ?diphenhydrAMINE (BENADRYL) injection 25 mg (25 mg Intravenous Given 07/18/21 0015)  ?morphine (PF) 4 MG/ML injection 6 mg (6 mg Intravenous Given 07/18/21 0013)  ?iohexol (OMNIPAQUE) 300 MG/ML solution 100 mL (100 mLs Intravenous Contrast Given 07/18/21 0047)  ?piperacillin-tazobactam (ZOSYN) IVPB 3.375 g (3.375 g Intravenous New Bag/Given 07/18/21 0125)  ?HYDROmorphone (DILAUDID) injection 1 mg (1 mg Intravenous Given 07/18/21 0124)  ? ? ?ED Course/ Medical Decision Making/ A&P ?  ?                        ?  Medical Decision Making ?Amount and/or Complexity of Data Reviewed ?Labs: ordered. ? ?Risk ?Prescription drug management. ?Decision regarding hospitalization. ? ? ?41 yo M with a chief complaints of diffuse abdominal discomfort.  This started just a couple hours ago.  The patient is quite uncomfortable.  Will obtain CT imaging.  Of note the patient had a similar presentation last time he was seen in the emergency department.  Had a CT scan at that time that was normal.  Not sure the exact cause of the patient's symptoms.  Will treat pain and nausea here.  Reassess. ? ?Patient having ongoing unrelenting pain on reassessment.  No leukocytosis no significant electrolyte abnormality LFTs and lipase are unremarkable. ? ?CT scan is resulted with hollow viscus perforation.  Thought to likely be due to gastric ulcer.  Will start on Zosyn.  Will discuss with general surgery. ? ?The patients results and plan were reviewed and discussed.   ?Any x-rays performed were independently reviewed by myself.  ? ?Differential diagnosis were  considered with the presenting HPI. ? ?Medications  ?morphine (PF) 2 MG/ML injection 2 mg (2 mg Intravenous Given 07/17/21 2343)  ?droperidol (INAPSINE) 2.5 MG/ML injection 1.25 mg (1.25 mg Intravenous Given 4/2

## 2021-07-19 ENCOUNTER — Encounter (HOSPITAL_COMMUNITY): Payer: Self-pay | Admitting: General Surgery

## 2021-07-19 ENCOUNTER — Inpatient Hospital Stay (HOSPITAL_COMMUNITY): Payer: Medicaid Other

## 2021-07-19 DIAGNOSIS — Z4682 Encounter for fitting and adjustment of non-vascular catheter: Secondary | ICD-10-CM | POA: Diagnosis not present

## 2021-07-19 LAB — CBC
HCT: 28.5 % — ABNORMAL LOW (ref 39.0–52.0)
Hemoglobin: 8.6 g/dL — ABNORMAL LOW (ref 13.0–17.0)
MCH: 23.2 pg — ABNORMAL LOW (ref 26.0–34.0)
MCHC: 30.2 g/dL (ref 30.0–36.0)
MCV: 76.8 fL — ABNORMAL LOW (ref 80.0–100.0)
Platelets: 397 10*3/uL (ref 150–400)
RBC: 3.71 MIL/uL — ABNORMAL LOW (ref 4.22–5.81)
RDW: 21.4 % — ABNORMAL HIGH (ref 11.5–15.5)
WBC: 18.1 10*3/uL — ABNORMAL HIGH (ref 4.0–10.5)
nRBC: 0 % (ref 0.0–0.2)

## 2021-07-19 LAB — BASIC METABOLIC PANEL
Anion gap: 6 (ref 5–15)
BUN: 15 mg/dL (ref 6–20)
CO2: 27 mmol/L (ref 22–32)
Calcium: 9.2 mg/dL (ref 8.9–10.3)
Chloride: 106 mmol/L (ref 98–111)
Creatinine, Ser: 0.97 mg/dL (ref 0.61–1.24)
GFR, Estimated: >60 mL/min (ref >60)
Glucose, Bld: 109 mg/dL — ABNORMAL HIGH (ref 70–99)
Potassium: 4.5 mmol/L (ref 3.5–5.1)
Sodium: 139 mmol/L (ref 135–145)

## 2021-07-19 LAB — H. PYLORI ANTIBODY, IGG: H Pylori IgG: 6.19 Index Value — ABNORMAL HIGH (ref 0.00–0.79)

## 2021-07-19 LAB — MAGNESIUM: Magnesium: 2.2 mg/dL (ref 1.7–2.4)

## 2021-07-19 LAB — PHOSPHORUS: Phosphorus: 4 mg/dL (ref 2.5–4.6)

## 2021-07-19 MED ORDER — METHOCARBAMOL 1000 MG/10ML IJ SOLN
1000.0000 mg | Freq: Three times a day (TID) | INTRAVENOUS | Status: DC
  Administered 2021-07-19 – 2021-07-23 (×12): 1000 mg via INTRAVENOUS
  Filled 2021-07-19 (×12): qty 1000
  Filled 2021-07-19 (×3): qty 10

## 2021-07-19 MED ORDER — ACETAMINOPHEN 10 MG/ML IV SOLN
1000.0000 mg | Freq: Four times a day (QID) | INTRAVENOUS | Status: AC
  Administered 2021-07-19 – 2021-07-20 (×4): 1000 mg via INTRAVENOUS
  Filled 2021-07-19 (×4): qty 100

## 2021-07-19 MED ORDER — METOPROLOL TARTRATE 5 MG/5ML IV SOLN
2.5000 mg | Freq: Four times a day (QID) | INTRAVENOUS | Status: DC
  Administered 2021-07-19 – 2021-07-20 (×4): 2.5 mg via INTRAVENOUS
  Filled 2021-07-19 (×4): qty 5

## 2021-07-19 MED ORDER — BENZOCAINE 20 % MT AERO
INHALATION_SPRAY | Freq: Four times a day (QID) | OROMUCOSAL | Status: DC | PRN
  Filled 2021-07-19: qty 57

## 2021-07-19 NOTE — Progress Notes (Signed)
? ?Progress Note ? ?1 Day Post-Op  ?Subjective: ?Pt reports significant pain with coughing. He reports NGT is uncomfortable and feels like it is choking him and keeps moving. He is not passing any flatus or stool yet. He was able to get up to chair yesterday. Wife will be here later. He reports he had been having abdominal pain for a while and had been taking lots of ibuprofen and cocaine to try to treat pain.  ? ?Objective: ?Vital signs in last 24 hours: ?Temp:  [97.9 ?F (36.6 ?C)-98.4 ?F (36.9 ?C)] 98 ?F (36.7 ?C) (04/27 0354) ?Pulse Rate:  [75-94] 75 (04/27 0917) ?Resp:  [15-20] 18 (04/27 0917) ?BP: (150-156)/(95-102) 151/102 (04/27 0917) ?SpO2:  [93 %-99 %] 96 % (04/27 0917) ?FiO2 (%):  [0 %] 0 % (04/27 0435) ?Last BM Date : 07/16/21 ? ?Intake/Output from previous day: ?04/26 0701 - 04/27 0700 ?In: 1143.5 [I.V.:780.2; IV Piggyback:363.3] ?Out: 4500 [Urine:3500; Emesis/NG output:1000] ?Intake/Output this shift: ?Total I/O ?In: 1815.8 [I.V.:1279.2; IV Piggyback:536.7] ?Out: 720 [Urine:320; Emesis/NG output:400] ? ?PE: ?General: pleasant, WD, thin male who is laying in bed and appears in pain ?Heart: regular, rate, and rhythm.   ?Lungs: Respiratory effort nonlabored ?Abd: soft, ND, appropriately ttp, midline wound clean, NGT with thin minimal drainage, BS hypoactive  ?MS: all 4 extremities are symmetrical with no cyanosis, clubbing, or edema. ?Skin: warm and dry with no masses, lesions, or rashes ?Psych: A&Ox3 with an appropriate affect.  ? ? ?Lab Results:  ?Recent Labs  ?  07/18/21 ?0857 07/19/21 ?0343  ?WBC 18.3* 18.1*  ?HGB 9.9* 8.6*  ?HCT 32.7* 28.5*  ?PLT 455* 397  ? ?BMET ?Recent Labs  ?  07/17/21 ?2330 07/18/21 ?0857 07/19/21 ?0343  ?NA 137  --  139  ?K 3.6  --  4.5  ?CL 100  --  106  ?CO2 30  --  27  ?GLUCOSE 122*  --  109*  ?BUN 11  --  15  ?CREATININE 1.11 1.00 0.97  ?CALCIUM 9.8  --  9.2  ? ?PT/INR ?No results for input(s): LABPROT, INR in the last 72 hours. ?CMP  ?   ?Component Value Date/Time  ? NA  139 07/19/2021 0343  ? K 4.5 07/19/2021 0343  ? CL 106 07/19/2021 0343  ? CO2 27 07/19/2021 0343  ? GLUCOSE 109 (H) 07/19/2021 0343  ? BUN 15 07/19/2021 0343  ? CREATININE 0.97 07/19/2021 0343  ? CALCIUM 9.2 07/19/2021 0343  ? PROT 8.0 07/17/2021 2330  ? ALBUMIN 3.4 (L) 07/17/2021 2330  ? AST 11 (L) 07/17/2021 2330  ? ALT 9 07/17/2021 2330  ? ALKPHOS 86 07/17/2021 2330  ? BILITOT 0.4 07/17/2021 2330  ? GFRNONAA >60 07/19/2021 0343  ? GFRAA >90 02/12/2013 1905  ? ?Lipase  ?   ?Component Value Date/Time  ? LIPASE 25 07/17/2021 2330  ? ? ? ? ? ?Studies/Results: ?CT ABDOMEN PELVIS W CONTRAST ? ?Result Date: 07/18/2021 ?CLINICAL DATA:  Severe abdominal pain EXAM: CT ABDOMEN AND PELVIS WITH CONTRAST TECHNIQUE: Multidetector CT imaging of the abdomen and pelvis was performed using the standard protocol following bolus administration of intravenous contrast. RADIATION DOSE REDUCTION: This exam was performed according to the departmental dose-optimization program which includes automated exposure control, adjustment of the mA and/or kV according to patient size and/or use of iterative reconstruction technique. CONTRAST:  OMNIPAQUE IOHEXOL 300 MG/ML  SOLN COMPARISON:  CT 05/27/2021 FINDINGS: Lower chest: Lung bases demonstrate no acute consolidation or effusion. Normal cardiac size Hepatobiliary: Central hepatic  cyst, no follow-up imaging is recommended. No gallstones, gallbladder wall thickening, or biliary dilatation. Pancreas: Unremarkable. No pancreatic ductal dilatation or surrounding inflammatory changes. Spleen: Normal in size without focal abnormality. Adrenals/Urinary Tract: Adrenal glands are normal. Kidneys show no hydronephrosis. The bladder is unremarkable. Stomach/Bowel: Moderate fluid distension of the stomach. Wall thickening of the pyloric region of the stomach with possible defect, coronal series 4, image 34. Nondilated small bowel. Slightly thickened appearing lower abdominal small bowel loops possibly  reactive. Negative appendix Vascular/Lymphatic: No significant vascular findings are present. No enlarged abdominal or pelvic lymph nodes. Reproductive: Prostate is unremarkable. Other: Moderate volume free air in the upper abdomen. Moderate free fluid within the abdomen and pelvis. Musculoskeletal: No acute or significant osseous findings. IMPRESSION: 1. Moderate volume free fluid and free air consistent with hollow viscus perforation. Suspected source is suspected to be secondary to perforated gastric ulcer as there is marked wall thickening of the pyloric region of the stomach with potential defect visible on coronal reconstructions. Critical Value/emergent results were called by telephone at the time of interpretation on 07/18/2021 at 1:10 am to provider Vena Austria of the ED, who verbally acknowledged these results. Electronically Signed   By: Jasmine Pang M.D.   On: 07/18/2021 01:11  ? ?DG Abd Portable 1V ? ?Result Date: 07/19/2021 ?CLINICAL DATA:  Nasogastric tube placement. EXAM: PORTABLE ABDOMEN - 1 VIEW COMPARISON:  May 21, 2010. FINDINGS: Distal tip of nasogastric tube is seen in expected position of distal esophagus just above gastroesophageal junction. Advancement is recommended. No abnormal bowel dilatation is noted IMPRESSION: Distal tip of nasogastric tube is seen in expected position of distal esophagus just above gastroesophageal junction; advancement is recommended. Electronically Signed   By: Lupita Raider M.D.   On: 07/19/2021 11:13   ? ?Anti-infectives: ?Anti-infectives (From admission, onward)  ? ? Start     Dose/Rate Route Frequency Ordered Stop  ? 07/18/21 0900  cefTRIAXone (ROCEPHIN) 2 g in sodium chloride 0.9 % 100 mL IVPB       ? 2 g ?200 mL/hr over 30 Minutes Intravenous Daily 07/18/21 0711 07/23/21 0959  ? 07/18/21 0900  metroNIDAZOLE (FLAGYL) IVPB 500 mg       ? 500 mg ?100 mL/hr over 60 Minutes Intravenous 2 times daily 07/18/21 0711 07/23/21 0959  ? 07/18/21 0115   piperacillin-tazobactam (ZOSYN) IVPB 3.375 g       ? 3.375 g ?100 mL/hr over 30 Minutes Intravenous  Once 07/18/21 0114 07/18/21 0216  ? ?  ? ? ? ?Assessment/Plan ?Perforated gastric ulcer of pyloric channel ?POD1 S/P exploratory laparotomy, graham patch repair 07/18/21 Dr. 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 ?FEN: NPO, IVF@125  cc/h ?VTE: LMWH ?ID: rocephin/flagyl 4/26>> ? LOS: 1 day  ? ? ? ?Juliet Rude, PA-C ?[facility] ?07/19/2021, 11:25 AM ?Please see Amion for pager number during day hours 7:00am-4:30pm ? ?

## 2021-07-19 NOTE — Plan of Care (Signed)
Abd Pain improving, cont on PCA, APAP, robaxin. NGT to LIS. Ambulated hallway, foley catheter removed. Passed TOV. C/o severe throat discomfort.  ? ?Problem: Education: ?Goal: Knowledge of General Education information will improve ?Description: Including pain rating scale, medication(s)/side effects and non-pharmacologic comfort measures ?Outcome: Progressing ?  ?Problem: Health Behavior/Discharge Planning: ?Goal: Ability to manage health-related needs will improve ?Outcome: Progressing ?  ?Problem: Clinical Measurements: ?Goal: Ability to maintain clinical measurements within normal limits will improve ?Outcome: Progressing ?Goal: Will remain free from infection ?Outcome: Progressing ?Goal: Diagnostic test results will improve ?Outcome: Progressing ?Goal: Respiratory complications will improve ?Outcome: Progressing ?Goal: Cardiovascular complication will be avoided ?Outcome: Progressing ?  ?Problem: Activity: ?Goal: Risk for activity intolerance will decrease ?Outcome: Progressing ?  ?Problem: Nutrition: ?Goal: Adequate nutrition will be maintained ?Outcome: Progressing ?  ?Problem: Coping: ?Goal: Level of anxiety will decrease ?Outcome: Progressing ?  ?Problem: Elimination: ?Goal: Will not experience complications related to bowel motility ?Outcome: Progressing ?Goal: Will not experience complications related to urinary retention ?Outcome: Progressing ?  ?Problem: Pain Managment: ?Goal: General experience of comfort will improve ?Outcome: Progressing ?  ?Problem: Safety: ?Goal: Ability to remain free from injury will improve ?Outcome: Progressing ?  ?Problem: Skin Integrity: ?Goal: Risk for impaired skin integrity will decrease ?Outcome: Progressing ?  ?

## 2021-07-20 LAB — CBC
HCT: 28.4 % — ABNORMAL LOW (ref 39.0–52.0)
Hemoglobin: 8.3 g/dL — ABNORMAL LOW (ref 13.0–17.0)
MCH: 22.9 pg — ABNORMAL LOW (ref 26.0–34.0)
MCHC: 29.2 g/dL — ABNORMAL LOW (ref 30.0–36.0)
MCV: 78.5 fL — ABNORMAL LOW (ref 80.0–100.0)
Platelets: 406 10*3/uL — ABNORMAL HIGH (ref 150–400)
RBC: 3.62 MIL/uL — ABNORMAL LOW (ref 4.22–5.81)
RDW: 21.2 % — ABNORMAL HIGH (ref 11.5–15.5)
WBC: 10.9 10*3/uL — ABNORMAL HIGH (ref 4.0–10.5)
nRBC: 0 % (ref 0.0–0.2)

## 2021-07-20 LAB — BASIC METABOLIC PANEL
Anion gap: 6 (ref 5–15)
BUN: 11 mg/dL (ref 6–20)
CO2: 27 mmol/L (ref 22–32)
Calcium: 9.1 mg/dL (ref 8.9–10.3)
Chloride: 105 mmol/L (ref 98–111)
Creatinine, Ser: 0.93 mg/dL (ref 0.61–1.24)
GFR, Estimated: >60 mL/min (ref >60)
Glucose, Bld: 110 mg/dL — ABNORMAL HIGH (ref 70–99)
Potassium: 3.9 mmol/L (ref 3.5–5.1)
Sodium: 138 mmol/L (ref 135–145)

## 2021-07-20 LAB — PREALBUMIN: Prealbumin: 14.2 mg/dL — ABNORMAL LOW (ref 18–38)

## 2021-07-20 MED ORDER — METOPROLOL TARTRATE 5 MG/5ML IV SOLN
5.0000 mg | Freq: Four times a day (QID) | INTRAVENOUS | Status: DC
  Administered 2021-07-20 – 2021-07-23 (×12): 5 mg via INTRAVENOUS
  Filled 2021-07-20 (×12): qty 5

## 2021-07-20 MED ORDER — ACETAMINOPHEN 10 MG/ML IV SOLN
1000.0000 mg | Freq: Four times a day (QID) | INTRAVENOUS | Status: AC
  Administered 2021-07-20 – 2021-07-21 (×3): 1000 mg via INTRAVENOUS
  Filled 2021-07-20 (×3): qty 100

## 2021-07-20 NOTE — Progress Notes (Signed)
? ?Progress Note ? ?2 Days Post-Op  ?Subjective: ?Pt reports pain improved some. No flatus yet. Reports he did ambulate yesterday. He wants some ice chips this AM. Did not get abdominal binder yesterday.   ? ?Objective: ?Vital signs in last 24 hours: ?Temp:  [98.1 ?F (36.7 ?C)-99.6 ?F (37.6 ?C)] 99.6 ?F (37.6 ?C) (04/28 6789) ?Pulse Rate:  [79-96] 96 (04/28 0855) ?Resp:  [15-20] 15 (04/28 0855) ?BP: (155-171)/(101-114) 171/109 (04/28 0855) ?SpO2:  [0 %-97 %] 92 % (04/28 0855) ?FiO2 (%):  [0 %] 0 % (04/28 0604) ?Last BM Date : 07/16/21 ? ?Intake/Output from previous day: ?04/27 0701 - 04/28 0700 ?In: 4088.8 [I.V.:2785.7; IV Piggyback:1303.1] ?Out: 3820 [Urine:2320; Emesis/NG output:1500] ?Intake/Output this shift: ?Total I/O ?In: 0  ?Out: 450 [Urine:300; Emesis/NG output:150] ? ?PE: ?General: pleasant, WD, thin male who is laying in bed and appears in pain ?Heart: regular, rate, and rhythm.   ?Lungs: Respiratory effort nonlabored ?Abd: soft, ND, appropriately ttp, NGT with thin minimal drainage, BS hypoactive  ?MS: all 4 extremities are symmetrical with no cyanosis, clubbing, or edema. ?Skin: warm and dry with no masses, lesions, or rashes ?Psych: A&Ox3 with an appropriate affect.  ? ? ?Lab Results:  ?Recent Labs  ?  07/19/21 ?0343 07/20/21 ?0354  ?WBC 18.1* 10.9*  ?HGB 8.6* 8.3*  ?HCT 28.5* 28.4*  ?PLT 397 406*  ? ? ?BMET ?Recent Labs  ?  07/19/21 ?0343 07/20/21 ?0354  ?NA 139 138  ?K 4.5 3.9  ?CL 106 105  ?CO2 27 27  ?GLUCOSE 109* 110*  ?BUN 15 11  ?CREATININE 0.97 0.93  ?CALCIUM 9.2 9.1  ? ? ?PT/INR ?No results for input(s): LABPROT, INR in the last 72 hours. ?CMP  ?   ?Component Value Date/Time  ? NA 138 07/20/2021 0354  ? K 3.9 07/20/2021 0354  ? CL 105 07/20/2021 0354  ? CO2 27 07/20/2021 0354  ? GLUCOSE 110 (H) 07/20/2021 0354  ? BUN 11 07/20/2021 0354  ? CREATININE 0.93 07/20/2021 0354  ? CALCIUM 9.1 07/20/2021 0354  ? PROT 8.0 07/17/2021 2330  ? ALBUMIN 3.4 (L) 07/17/2021 2330  ? AST 11 (L) 07/17/2021 2330   ? ALT 9 07/17/2021 2330  ? ALKPHOS 86 07/17/2021 2330  ? BILITOT 0.4 07/17/2021 2330  ? GFRNONAA >60 07/20/2021 0354  ? GFRAA >90 02/12/2013 1905  ? ?Lipase  ?   ?Component Value Date/Time  ? LIPASE 25 07/17/2021 2330  ? ? ? ? ? ?Studies/Results: ?DG Abd 1 View ? ?Result Date: 07/19/2021 ?CLINICAL DATA:  Nasogastric tube placement. EXAM: ABDOMEN - 1 VIEW COMPARISON:  Same day. FINDINGS: Distal tip of nasogastric tube has been advanced into the proximal stomach. IMPRESSION: Distal tip of nasogastric tube has been advanced into the proximal stomach. Electronically Signed   By: Lupita Raider M.D.   On: 07/19/2021 13:29  ? ?DG Abd Portable 1V ? ?Result Date: 07/19/2021 ?CLINICAL DATA:  Nasogastric tube placement. EXAM: PORTABLE ABDOMEN - 1 VIEW COMPARISON:  May 21, 2010. FINDINGS: Distal tip of nasogastric tube is seen in expected position of distal esophagus just above gastroesophageal junction. Advancement is recommended. No abnormal bowel dilatation is noted IMPRESSION: Distal tip of nasogastric tube is seen in expected position of distal esophagus just above gastroesophageal junction; advancement is recommended. Electronically Signed   By: Lupita Raider M.D.   On: 07/19/2021 11:13   ? ?Anti-infectives: ?Anti-infectives (From admission, onward)  ? ? Start     Dose/Rate Route Frequency Ordered Stop  ?  07/18/21 0900  cefTRIAXone (ROCEPHIN) 2 g in sodium chloride 0.9 % 100 mL IVPB       ? 2 g ?200 mL/hr over 30 Minutes Intravenous Daily 07/18/21 0711 07/23/21 0959  ? 07/18/21 0900  metroNIDAZOLE (FLAGYL) IVPB 500 mg       ? 500 mg ?100 mL/hr over 60 Minutes Intravenous 2 times daily 07/18/21 0711 07/23/21 0959  ? 07/18/21 0115  piperacillin-tazobactam (ZOSYN) IVPB 3.375 g       ? 3.375 g ?100 mL/hr over 30 Minutes Intravenous  Once 07/18/21 0114 07/18/21 0216  ? ?  ? ? ? ?Assessment/Plan ?Perforated gastric ulcer of pyloric channel ?POD2 S/P exploratory laparotomy, graham patch repair 07/18/21 Dr. 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 ?FEN: NPO, IVF@125  cc/h ?VTE: LMWH ?ID: rocephin/flagyl 4/26>> ? LOS: 2 days  ? ? ? ?Juliet Rude, PA-C ?[facility] ?07/20/2021, 10:40 AM ?Please see Amion for pager number during day hours 7:00am-4:30pm ? ?

## 2021-07-21 MED ORDER — ACETAMINOPHEN 325 MG PO TABS: 650.0000 mg | ORAL_TABLET | Freq: Four times a day (QID) | ORAL | Status: DC | PRN

## 2021-07-21 MED ORDER — ACETAMINOPHEN 650 MG RE SUPP
650.0000 mg | Freq: Four times a day (QID) | RECTAL | Status: DC | PRN
  Administered 2021-07-21: 650 mg via RECTAL
  Filled 2021-07-21: qty 1

## 2021-07-21 NOTE — Progress Notes (Signed)
3 Days Post-Op  ? ?Subjective/Chief Complaint: ?No complaints  ? ? ? ?Objective: ?Vital signs in last 24 hours: ?Temp:  [98.4 ?F (36.9 ?C)-99.9 ?F (37.7 ?C)] 99.3 ?F (37.4 ?C) (04/29 0950) ?Pulse Rate:  [82-97] 89 (04/29 0950) ?Resp:  [16-30] 18 (04/29 0950) ?BP: (146-168)/(92-106) 165/97 (04/29 0950) ?SpO2:  [90 %-98 %] 97 % (04/29 0950) ?FiO2 (%):  [0 %] 0 % (04/28 1658) ?Last BM Date : 07/16/21 ? ?Intake/Output from previous day: ?04/28 0701 - 04/29 0700 ?In: 500 [IV Piggyback:500] ?Out: 3150 [Urine:1850; Emesis/NG output:1300] ?Intake/Output this shift: ?No intake/output data recorded. ? ? ?General: pleasant, WD, thin male who is laying in bed and appears in pain ?Heart: regular, rate, and rhythm.   ?Lungs: Respiratory effort nonlabored ?Abd: soft, ND, appropriately ttp, NGT with thin minimal drainage, BS hypoactive  ?MS: all 4 extremities are symmetrical with no cyanosis, clubbing, or edema. ?Skin: warm and dry with no masses, lesions, or rashes ?Psych: A&Ox3 with an appropriate affect.  ? ? ?Lab Results:  ?Recent Labs  ?  07/19/21 ?0343 07/20/21 ?0354  ?WBC 18.1* 10.9*  ?HGB 8.6* 8.3*  ?HCT 28.5* 28.4*  ?PLT 397 406*  ? ?BMET ?Recent Labs  ?  07/19/21 ?0343 07/20/21 ?0354  ?NA 139 138  ?K 4.5 3.9  ?CL 106 105  ?CO2 27 27  ?GLUCOSE 109* 110*  ?BUN 15 11  ?CREATININE 0.97 0.93  ?CALCIUM 9.2 9.1  ? ?PT/INR ?No results for input(s): LABPROT, INR in the last 72 hours. ?ABG ?No results for input(s): PHART, HCO3 in the last 72 hours. ? ?Invalid input(s): PCO2, PO2 ? ?Studies/Results: ?DG Abd 1 View ? ?Result Date: 07/19/2021 ?CLINICAL DATA:  Nasogastric tube placement. EXAM: ABDOMEN - 1 VIEW COMPARISON:  Same day. FINDINGS: Distal tip of nasogastric tube has been advanced into the proximal stomach. IMPRESSION: Distal tip of nasogastric tube has been advanced into the proximal stomach. Electronically Signed   By: Lupita Raider M.D.   On: 07/19/2021 13:29  ? ?DG Abd Portable 1V ? ?Result Date: 07/19/2021 ?CLINICAL  DATA:  Nasogastric tube placement. EXAM: PORTABLE ABDOMEN - 1 VIEW COMPARISON:  May 21, 2010. FINDINGS: Distal tip of nasogastric tube is seen in expected position of distal esophagus just above gastroesophageal junction. Advancement is recommended. No abnormal bowel dilatation is noted IMPRESSION: Distal tip of nasogastric tube is seen in expected position of distal esophagus just above gastroesophageal junction; advancement is recommended. Electronically Signed   By: Lupita Raider M.D.   On: 07/19/2021 11:13   ? ?Anti-infectives: ?Anti-infectives (From admission, onward)  ? ? Start     Dose/Rate Route Frequency Ordered Stop  ? 07/18/21 0900  cefTRIAXone (ROCEPHIN) 2 g in sodium chloride 0.9 % 100 mL IVPB       ? 2 g ?200 mL/hr over 30 Minutes Intravenous Daily 07/18/21 0711 07/21/21 1006  ? 07/18/21 0900  metroNIDAZOLE (FLAGYL) IVPB 500 mg       ? 500 mg ?100 mL/hr over 60 Minutes Intravenous 2 times daily 07/18/21 0711 07/23/21 0959  ? 07/18/21 0115  piperacillin-tazobactam (ZOSYN) IVPB 3.375 g       ? 3.375 g ?100 mL/hr over 30 Minutes Intravenous  Once 07/18/21 0114 07/18/21 0216  ? ?  ? ? ?Assessment/Plan: ? ?Perforated gastric ulcer of pyloric channel ?POD 3 S/P exploratory laparotomy, graham patch repair 07/18/21 Dr. 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  ?FEN: NPO, IVF@125  cc/h ?VTE: LMWH ?ID:  rocephin/flagyl 4/26>> ? LOS: 3 days  ? ? ?Dortha Schwalbe   MD  ?07/21/2021 ? ?

## 2021-07-22 ENCOUNTER — Inpatient Hospital Stay (HOSPITAL_COMMUNITY): Payer: Medicaid Other

## 2021-07-22 DIAGNOSIS — Z4682 Encounter for fitting and adjustment of non-vascular catheter: Secondary | ICD-10-CM | POA: Diagnosis not present

## 2021-07-22 DIAGNOSIS — K7689 Other specified diseases of liver: Secondary | ICD-10-CM | POA: Diagnosis not present

## 2021-07-22 DIAGNOSIS — K8020 Calculus of gallbladder without cholecystitis without obstruction: Secondary | ICD-10-CM | POA: Diagnosis not present

## 2021-07-22 LAB — BASIC METABOLIC PANEL
Anion gap: 6 (ref 5–15)
BUN: 10 mg/dL (ref 6–20)
CO2: 24 mmol/L (ref 22–32)
Calcium: 8.5 mg/dL — ABNORMAL LOW (ref 8.9–10.3)
Chloride: 106 mmol/L (ref 98–111)
Creatinine, Ser: 0.68 mg/dL (ref 0.61–1.24)
GFR, Estimated: >60 mL/min (ref >60)
Glucose, Bld: 99 mg/dL (ref 70–99)
Potassium: 3.5 mmol/L (ref 3.5–5.1)
Sodium: 136 mmol/L (ref 135–145)

## 2021-07-22 LAB — CBC
HCT: 28.1 % — ABNORMAL LOW (ref 39.0–52.0)
Hemoglobin: 8.7 g/dL — ABNORMAL LOW (ref 13.0–17.0)
MCH: 23.3 pg — ABNORMAL LOW (ref 26.0–34.0)
MCHC: 31 g/dL (ref 30.0–36.0)
MCV: 75.1 fL — ABNORMAL LOW (ref 80.0–100.0)
Platelets: 344 10*3/uL (ref 150–400)
RBC: 3.74 MIL/uL — ABNORMAL LOW (ref 4.22–5.81)
RDW: 20.4 % — ABNORMAL HIGH (ref 11.5–15.5)
WBC: 10.7 10*3/uL — ABNORMAL HIGH (ref 4.0–10.5)
nRBC: 0 % (ref 0.0–0.2)

## 2021-07-22 MED ORDER — SODIUM CHLORIDE 0.9 % IV SOLN
2.0000 g | Freq: Every day | INTRAVENOUS | Status: AC
  Administered 2021-07-22: 2 g via INTRAVENOUS
  Filled 2021-07-22: qty 20

## 2021-07-22 MED ORDER — IOHEXOL 9 MG/ML PO SOLN
ORAL | Status: AC
Start: 2021-07-22 — End: 2021-07-22
  Administered 2021-07-22: 500 mL
  Filled 2021-07-22: qty 1000

## 2021-07-22 MED ORDER — IOHEXOL 9 MG/ML PO SOLN
ORAL | Status: AC
  Filled 2021-07-22: qty 1000

## 2021-07-22 MED ORDER — SODIUM CHLORIDE (PF) 0.9 % IJ SOLN
INTRAMUSCULAR | Status: AC
  Filled 2021-07-22: qty 100

## 2021-07-22 MED ORDER — IOHEXOL 300 MG/ML  SOLN
100.0000 mL | Freq: Once | INTRAMUSCULAR | Status: AC | PRN
  Administered 2021-07-22: 100 mL via INTRAVENOUS

## 2021-07-22 MED ORDER — IOHEXOL 9 MG/ML PO SOLN
500.0000 mL | ORAL | Status: AC
  Administered 2021-07-22 (×2): 500 mL via ORAL

## 2021-07-22 NOTE — Progress Notes (Signed)
4 Days Post-Op  ? ?Subjective/Chief Complaint: ?Resting comfortably ?No worsening in abdominal pain ?No new labs ?Febrile overnight ? ? ?Objective: ?Vital signs in last 24 hours: ?Temp:  [98.5 ?F (36.9 ?C)-101.7 ?F (38.7 ?C)] 100.1 ?F (37.8 ?C) (04/30 4166) ?Pulse Rate:  [82-106] 82 (04/30 0630) ?Resp:  [18-22] 20 (04/30 1601) ?BP: (140-165)/(87-98) 143/96 (04/30 0932) ?SpO2:  [90 %-99 %] 97 % (04/30 3557) ?Last BM Date : 07/16/21 ? ?Intake/Output from previous day: ?04/29 0701 - 04/30 0700 ?In: 3229.7 [P.O.:210; I.V.:2589.8; IV Piggyback:429.9] ?Out: 3475 [Urine:3125; Emesis/NG output:350] ?Intake/Output this shift: ?No intake/output data recorded. ? ?General: pleasant, WD, thin male who is laying in bed and appears in pain ?Heart: regular, rate, and rhythm.   ?Lungs: Respiratory effort nonlabored ?Abd: soft, ND, appropriately ttp, NGT with thin minimal drainage, BS hypoactive  ?MS: all 4 extremities are symmetrical with no cyanosis, clubbing, or edema. ?Skin: warm and dry with no masses, lesions, or rashes ?Psych: A&Ox3 with an appropriate affect.  ? ?Lab Results:  ?Recent Labs  ?  07/20/21 ?0354  ?WBC 10.9*  ?HGB 8.3*  ?HCT 28.4*  ?PLT 406*  ? ?BMET ?Recent Labs  ?  07/20/21 ?0354  ?NA 138  ?K 3.9  ?CL 105  ?CO2 27  ?GLUCOSE 110*  ?BUN 11  ?CREATININE 0.93  ?CALCIUM 9.1  ? ?PT/INR ?No results for input(s): LABPROT, INR in the last 72 hours. ?ABG ?No results for input(s): PHART, HCO3 in the last 72 hours. ? ?Invalid input(s): PCO2, PO2 ? ?Studies/Results: ?No results found. ? ?Anti-infectives: ?Anti-infectives (From admission, onward)  ? ? Start     Dose/Rate Route Frequency Ordered Stop  ? 07/18/21 0900  cefTRIAXone (ROCEPHIN) 2 g in sodium chloride 0.9 % 100 mL IVPB       ? 2 g ?200 mL/hr over 30 Minutes Intravenous Daily 07/18/21 0711 07/21/21 1006  ? 07/18/21 0900  metroNIDAZOLE (FLAGYL) IVPB 500 mg       ? 500 mg ?100 mL/hr over 60 Minutes Intravenous 2 times daily 07/18/21 0711 07/23/21 0959  ? 07/18/21 0115   piperacillin-tazobactam (ZOSYN) IVPB 3.375 g       ? 3.375 g ?100 mL/hr over 30 Minutes Intravenous  Once 07/18/21 0114 07/18/21 0216  ? ?  ? ? ?Assessment/Plan: ?Perforated gastric ulcer of pyloric channel ?POD 4 S/P exploratory laparotomy, graham patch repair 07/18/21 Dr. 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  ?FEN: NPO, IVF@125  cc/h ?VTE: LMWH ?ID: rocephin/flagyl 4/26>> ? LOS: 4 days  ? ? ?Wilmon Arms Cesar Rogerson ?07/22/2021 ? ?

## 2021-07-22 NOTE — Progress Notes (Signed)
?   07/21/21 2112  ?Assess: MEWS Score  ?Temp (!) 101.7 ?F (38.7 ?C)  ?BP (!) 155/98  ?Pulse Rate (!) 106  ?Resp (!) 22  ?SpO2 94 %  ?O2 Device Nasal Cannula  ?O2 Flow Rate (L/min) 2 L/min  ?Assess: MEWS Score  ?MEWS Temp 2  ?MEWS Systolic 0  ?MEWS Pulse 1  ?MEWS RR 1  ?MEWS LOC 0  ?MEWS Score 4  ?MEWS Score Color Red  ?Assess: if the MEWS score is Yellow or Red  ?Were vital signs taken at a resting state? Yes  ?Focused Assessment Change from prior assessment (see assessment flowsheet)  ?Does the patient meet 2 or more of the SIRS criteria? No  ?MEWS guidelines implemented *See Row Information* Yes  ?Treat  ?MEWS Interventions Administered prn meds/treatments  ?Multiple Pain Sites No  ?Take Vital Signs  ?Increase Vital Sign Frequency  Red: Q 1hr X 4 then Q 4hr X 4, if remains red, continue Q 4hrs  ?Escalate  ?MEWS: Escalate Red: discuss with charge nurse/RN and provider, consider discussing with RRT  ?Notify: Charge Nurse/RN  ?Name of Charge Nurse/RN Notified Haylyn Halberg RN/Rupinder RN  ?Date Charge Nurse/RN Notified 07/21/21  ?Time Charge Nurse/RN Notified 2112  ?Notify: Provider  ?Provider Name/Title Tom,Cornett  ?Date Provider Notified 07/21/21  ?Time Provider Notified 2120  ?Notification Type Page  ?Notification Reason Change in status  ?Provider response No new orders  ?Date of Provider Response 07/22/21 ?(ordered PRN TYlenol as PRN)  ?Time of Provider Response 2130  ?Document  ?Patient Outcome Stabilized after interventions  ?Progress note created (see row info) Yes  ?Assess: SIRS CRITERIA  ?SIRS Temperature  1  ?SIRS Pulse 1  ?SIRS Respirations  1  ?SIRS WBC 0  ?SIRS Score Sum  3  ? ? ?

## 2021-07-22 NOTE — Progress Notes (Signed)
?   07/21/21 2112  ?Assess: MEWS Score  ?Temp (!) 101.7 ?F (38.7 ?C)  ?BP (!) 155/98  ?Pulse Rate (!) 106  ?Resp (!) 22  ?SpO2 94 %  ?O2 Device Nasal Cannula  ?O2 Flow Rate (L/min) 2 L/min  ?Assess: MEWS Score  ?MEWS Temp 2  ?MEWS Systolic 0  ?MEWS Pulse 1  ?MEWS RR 1  ?MEWS LOC 0  ?MEWS Score 4  ?MEWS Score Color Red  ?Assess: if the MEWS score is Yellow or Red  ?Were vital signs taken at a resting state? Yes  ?Focused Assessment Change from prior assessment (see assessment flowsheet)  ?Does the patient meet 2 or more of the SIRS criteria? No  ?MEWS guidelines implemented *See Row Information* Yes  ?Treat  ?MEWS Interventions Administered prn meds/treatments  ?Multiple Pain Sites No  ?Take Vital Signs  ?Increase Vital Sign Frequency  Red: Q 1hr X 4 then Q 4hr X 4, if remains red, continue Q 4hrs  ?Escalate  ?MEWS: Escalate Red: discuss with charge nurse/RN and provider, consider discussing with RRT  ?Notify: Charge Nurse/RN  ?Name of Charge Nurse/RN Notified Rye Decoste RN/Rupinder RN  ?Date Charge Nurse/RN Notified 07/21/21  ?Time Charge Nurse/RN Notified 2112  ?Notify: Provider  ?Provider Name/Title Tom,Cornett  ?Date Provider Notified 07/21/21  ?Time Provider Notified 2100  ?Notification Type Page  ?Notification Reason Change in status  ?Provider response No new orders  ?Date of Provider Response 07/22/21 ?(ordered PRN TYlenol as PRN)  ?Time of Provider Response 2130  ?Document  ?Patient Outcome Stabilized after interventions  ?Progress note created (see row info) Yes  ?Assess: SIRS CRITERIA  ?SIRS Temperature  1  ?SIRS Pulse 1  ?SIRS Respirations  1  ?SIRS WBC 0  ?SIRS Score Sum  3  ? ? ?

## 2021-07-23 LAB — CBC
HCT: 29.1 % — ABNORMAL LOW (ref 39.0–52.0)
Hemoglobin: 8.7 g/dL — ABNORMAL LOW (ref 13.0–17.0)
MCH: 22.9 pg — ABNORMAL LOW (ref 26.0–34.0)
MCHC: 29.9 g/dL — ABNORMAL LOW (ref 30.0–36.0)
MCV: 76.6 fL — ABNORMAL LOW (ref 80.0–100.0)
Platelets: 343 10*3/uL (ref 150–400)
RBC: 3.8 MIL/uL — ABNORMAL LOW (ref 4.22–5.81)
RDW: 20.7 % — ABNORMAL HIGH (ref 11.5–15.5)
WBC: 7.5 10*3/uL (ref 4.0–10.5)
nRBC: 0 % (ref 0.0–0.2)

## 2021-07-23 LAB — BASIC METABOLIC PANEL
Anion gap: 7 (ref 5–15)
BUN: 10 mg/dL (ref 6–20)
CO2: 25 mmol/L (ref 22–32)
Calcium: 8.4 mg/dL — ABNORMAL LOW (ref 8.9–10.3)
Chloride: 103 mmol/L (ref 98–111)
Creatinine, Ser: 0.79 mg/dL (ref 0.61–1.24)
GFR, Estimated: >60 mL/min (ref >60)
Glucose, Bld: 101 mg/dL — ABNORMAL HIGH (ref 70–99)
Potassium: 3.7 mmol/L (ref 3.5–5.1)
Sodium: 135 mmol/L (ref 135–145)

## 2021-07-23 MED ORDER — METHOCARBAMOL 500 MG PO TABS
1000.0000 mg | ORAL_TABLET | Freq: Four times a day (QID) | ORAL | Status: DC | PRN
  Administered 2021-07-23 – 2021-07-24 (×2): 1000 mg via ORAL
  Filled 2021-07-23 (×2): qty 2

## 2021-07-23 MED ORDER — METRONIDAZOLE 500 MG PO TABS
500.0000 mg | ORAL_TABLET | Freq: Two times a day (BID) | ORAL | Status: DC
  Administered 2021-07-23 – 2021-07-24 (×3): 500 mg via ORAL
  Filled 2021-07-23 (×3): qty 1

## 2021-07-23 MED ORDER — AMOXICILLIN 500 MG PO CAPS
1000.0000 mg | ORAL_CAPSULE | Freq: Two times a day (BID) | ORAL | Status: DC
  Administered 2021-07-23 – 2021-07-24 (×3): 1000 mg via ORAL
  Filled 2021-07-23 (×3): qty 2

## 2021-07-23 MED ORDER — OXYCODONE HCL 5 MG PO TABS
5.0000 mg | ORAL_TABLET | ORAL | Status: DC | PRN
  Administered 2021-07-23 – 2021-07-24 (×4): 10 mg via ORAL
  Filled 2021-07-23 (×4): qty 2

## 2021-07-23 MED ORDER — ACETAMINOPHEN 500 MG PO TABS
1000.0000 mg | ORAL_TABLET | Freq: Four times a day (QID) | ORAL | Status: DC
  Administered 2021-07-23 – 2021-07-24 (×3): 1000 mg via ORAL
  Filled 2021-07-23 (×4): qty 2

## 2021-07-23 MED ORDER — MORPHINE SULFATE (PF) 2 MG/ML IV SOLN: 2.0000 mg | INTRAVENOUS | Status: DC | PRN

## 2021-07-23 MED ORDER — CLARITHROMYCIN 250 MG PO TABS
500.0000 mg | ORAL_TABLET | Freq: Two times a day (BID) | ORAL | Status: DC
  Administered 2021-07-23 – 2021-07-24 (×3): 500 mg via ORAL
  Filled 2021-07-23 (×3): qty 2

## 2021-07-23 MED ORDER — METOPROLOL TARTRATE 25 MG PO TABS
25.0000 mg | ORAL_TABLET | Freq: Two times a day (BID) | ORAL | Status: DC
  Administered 2021-07-23 – 2021-07-24 (×3): 25 mg via ORAL
  Filled 2021-07-23 (×3): qty 1

## 2021-07-23 MED ORDER — PANTOPRAZOLE SODIUM 40 MG PO TBEC
40.0000 mg | DELAYED_RELEASE_TABLET | Freq: Two times a day (BID) | ORAL | Status: DC
  Administered 2021-07-23 – 2021-07-24 (×3): 40 mg via ORAL
  Filled 2021-07-23 (×3): qty 1

## 2021-07-23 MED ORDER — POLYETHYLENE GLYCOL 3350 17 G PO PACK: 17.0000 g | PACK | Freq: Every day | ORAL | Status: DC | PRN

## 2021-07-23 NOTE — TOC Progression Note (Signed)
Transition of Care (TOC) - Progression Note  ? ?Patient Details  ?Name: [name] ?MRN: [id number] ?Date of Birth: 11/11/1980 ? ?Transition of Care (TOC) CM/SW Contact  ?Ewing Schlein, LCSW ?Phone Number: ?07/23/2021, 11:16 AM ? ?Clinical Narrative: TOC consulted for PCP assistance, but patient has Medicaid so he was assigned a PCP. TOC to clear consult. ? ?Barriers to Discharge: Continued Medical Work up ? ?Readmission Risk Interventions ?   ? View : No data to display.  ?  ?  ?  ? ?

## 2021-07-23 NOTE — Discharge Instructions (Addendum)
You will also need to follow up with GI in about 6-8 weeks for follow up endoscopy to make sure everything has healed appropriately. Your primary care physician can help with this referral or you can call and make an appointment. The GI practices here in [place] are [facility] and [facility]. If you live outside of [place], you can also see a GI physician locally if there is one. ? ? ?WOUND CARE: ?- dressing to be changed daily ?- supplies: sterile saline, kerlix/guaze, scissors, ABD pads, tape  ?- remove dressing and all packing carefully, moistening with sterile saline as needed to avoid packing/internal dressing sticking to the wound. ?- clean edges of skin around the wound with water/gauze, making sure there is no tape debris or leakage left on skin that could cause skin irritation or breakdown. ?- dampen clean kerlix/gauze with sterile saline and pack wound from wound base to skin level, making sure to take note of any possible areas of wound tracking, tunneling and packing appropriately. Wound can be packed loosely. Trim kerlix/gauze to size if a whole roll/piece is not required. ?- cover wound with a dry ABD pad and secure with tape.  ?- write the date/time on the dry dressing/tape to better track when the last dressing change occurred. ?- apply any skin protectant/powder recommended by clinician to protect skin/skin folds. ?- change dressing as needed if leakage occurs, wound gets contaminated, or patient requests to shower. ?- patient may shower daily with wound open and following the shower the wound should be dried and a clean dressing placed.   ? ? ? ?CCS      [facility], [place] ?[phone] ? ?OPEN ABDOMINAL SURGERY: POST OP INSTRUCTIONS ? ?Always review your discharge instruction sheet given to you by the facility where your surgery was performed. ? ?IF YOU HAVE DISABILITY OR FAMILY LEAVE FORMS, YOU MUST BRING THEM TO THE OFFICE FOR PROCESSING.  PLEASE DO NOT GIVE THEM TO YOUR  DOCTOR. ? ?A prescription for pain medication may be given to you upon discharge.  Take your pain medication as prescribed, if needed.  If narcotic pain medicine is not needed, then you may take acetaminophen (Tylenol) as needed. ?Take your usually prescribed medications unless otherwise directed. ?If you need a refill on your pain medication, please contact your pharmacy. They will contact our office to request authorization.  Prescriptions will not be filled after 5pm or on week-ends. ?You should follow a light diet the first few days after arrival home, such as soup and crackers, pudding, etc.unless your doctor has advised otherwise. A high-fiber, low fat diet can be resumed as tolerated.   Be sure to include lots of fluids daily. Most patients will experience some swelling and bruising on the chest and neck area.  Ice packs will help.  Swelling and bruising can take several days to resolve ?Most patients will experience some swelling and bruising in the area of the incision. Ice pack will help. Swelling and bruising can take several days to resolve.Marland Kitchen  ?It is common to experience some constipation if taking pain medication after surgery.  Increasing fluid intake and taking a stool softener will usually help or prevent this problem from occurring.  A mild laxative (Milk of Magnesia or Miralax) should be taken according to package directions if there are no bowel movements after 48 hours. ? You may have steri-strips (small skin tapes) in place directly over the incision.  These strips should be left on the skin for 7-10 days.  If your surgeon used skin glue on the incision, you may shower in 24 hours.  The glue will flake off over the next 2-3 weeks.  Any sutures or staples will be removed at the office during your follow-up visit. You may find that a light gauze bandage over your incision may keep your staples from being rubbed or pulled. You may shower and replace the bandage daily. ?ACTIVITIES:  You may resume  regular (light) daily activities beginning the next day--such as daily self-care, walking, climbing stairs--gradually increasing activities as tolerated.  You may have sexual intercourse when it is comfortable.  Refrain from any heavy lifting or straining until approved by your doctor. ?You may drive when you no longer are taking prescription pain medication, you can comfortably wear a seatbelt, and you can safely maneuver your car and apply brakes ? ?You should see your doctor in the office for a follow-up appointment approximately two weeks after your surgery.  Make sure that you call for this appointment within a day or two after you arrive home to insure a convenient appointment time. ? ? ?WHEN TO CALL YOUR DOCTOR: ?Fever over 101.0 ?Inability to urinate ?Nausea and/or vomiting ?Extreme swelling or bruising ?Continued bleeding from incision. ?Increased pain, redness, or drainage from the incision. ?Difficulty swallowing or breathing ?Muscle cramping or spasms. ?Numbness or tingling in hands or feet or around lips. ? ?The clinic staff is available to answer your questions during regular business hours.  Please don?t hesitate to call and ask to speak to one of the nurses if you have concerns. ? ?For further questions, please visit [email or web address]  ?

## 2021-07-23 NOTE — Progress Notes (Signed)
Patient's PCA was discontinued. 14 ml of morphine was wasted and was witnessed by [employer]. ?

## 2021-07-23 NOTE — Progress Notes (Signed)
? ? ?5 Days Post-Op  ?Subjective: ?CC: ?Doing well. Less abdominal pain - well controlled w/ PCA. No nausea. NGT w/ 1.4L out/24 hours, bilious. Passing flatus. BM overnight. Voiding without difficulty. Mobilizing halls. No CP or sob. Dry, non-productive cough. Pulling 1000 on IS.  ? ?Objective: ?Vital signs in last 24 hours: ?Temp:  [98.2 ?F (36.8 ?C)-98.9 ?F (37.2 ?C)] 98.2 ?F (36.8 ?C) (05/01 0525) ?Pulse Rate:  [72-77] 72 (05/01 0624) ?Resp:  [18-19] 18 (05/01 0807) ?BP: (130-159)/(89-106) 156/106 (05/01 7867) ?SpO2:  [91 %-100 %] 93 % (05/01 0807) ?Last BM Date : 07/22/21 ? ?Intake/Output from previous day: ?04/30 0701 - 05/01 0700 ?In: 2836.3 [I.V.:2466.1; IV Piggyback:370.2] ?Out: 2625 [Urine:1225; Emesis/NG output:1400] ?Intake/Output this shift: ?No intake/output data recorded. ? ?PE: ?Gen:  Alert, NAD, pleasant ?Card:  RRR ?Pulm:  CTAB, no W/R/R, effort normal. 1000 on IS.  ?Abd: Soft, ND, appropriately tender, no peritonitis, +BS, midline wound clean and repacked.  ?Ext:  No LE edema or calf tenderness ?Psych: A&Ox3  ? ?Lab Results:  ?Recent Labs  ?  07/22/21 ?6720  ?WBC 10.7*  ?HGB 8.7*  ?HCT 28.1*  ?PLT 344  ? ?BMET ?Recent Labs  ?  07/22/21 ?9470  ?NA 136  ?K 3.5  ?CL 106  ?CO2 24  ?GLUCOSE 99  ?BUN 10  ?CREATININE 0.68  ?CALCIUM 8.5*  ? ?PT/INR ?No results for input(s): LABPROT, INR in the last 72 hours. ?CMP  ?   ?Component Value Date/Time  ? NA 136 07/22/2021 0947  ? K 3.5 07/22/2021 0947  ? CL 106 07/22/2021 0947  ? CO2 24 07/22/2021 0947  ? GLUCOSE 99 07/22/2021 0947  ? BUN 10 07/22/2021 0947  ? CREATININE 0.68 07/22/2021 0947  ? CALCIUM 8.5 (L) 07/22/2021 0947  ? PROT 8.0 07/17/2021 2330  ? ALBUMIN 3.4 (L) 07/17/2021 2330  ? AST 11 (L) 07/17/2021 2330  ? ALT 9 07/17/2021 2330  ? ALKPHOS 86 07/17/2021 2330  ? BILITOT 0.4 07/17/2021 2330  ? GFRNONAA >60 07/22/2021 0947  ? GFRAA >90 02/12/2013 1905  ? ?Lipase  ?   ?Component Value Date/Time  ? LIPASE 25 07/17/2021 2330  ? ? ?Studies/Results: ?CT  ABDOMEN PELVIS W WO CONTRAST ? ?Result Date: 07/23/2021 ?CLINICAL DATA:  Status post surgery for perforated ulcer. EXAM: CT ABDOMEN AND PELVIS WITHOUT AND WITH CONTRAST TECHNIQUE: Multidetector CT imaging of the abdomen and pelvis was performed following the standard protocol before and following the bolus administration of intravenous contrast. RADIATION DOSE REDUCTION: This exam was performed according to the departmental dose-optimization program which includes automated exposure control, adjustment of the mA and/or kV according to patient size and/or use of iterative reconstruction technique. CONTRAST:  OMNIPAQUE IOHEXOL 300 MG/ML  SOLN COMPARISON:  CT scans 07/20/2021 FINDINGS: Lower chest: Dense airspace consolidation in the right lower lobe with air bronchograms consistent with large area of pneumonia. There is also patchy airspace opacity and tree-in-bud nodularity in the left lower lobe suggesting early pneumonia. Findings could be due to aspiration. An NG tube is coursing down esophagus and into the stomach. Hepatobiliary: Stable simple hepatic cysts. No worrisome hepatic lesions or intrahepatic biliary dilatation. The gallbladder contains some layering gallstones. No common bile duct dilatation. Pancreas: No mass, inflammation or ductal dilatation. Spleen: Normal size. No focal lesions. Adrenals/Urinary Tract: Adrenal glands and kidneys are unremarkable and stable. Stomach/Bowel: There is an NG tube in the stomach. The duodenum, small colon are. No leaking contrast material or free air to suggest surgical complication.  No abscess is identified. Vascular/Lymphatic: The aorta and branch vessels are patent. The major venous structures are patent. Reproductive: The prostate gland and seminal vesicles are. Other: No pelvic mass or adenopathy. No free pelvic fluid collections. No inguinal mass or adenopathy. No abdominal wall hernia or subcutaneous lesions. Musculoskeletal: No significant bony findings.  IMPRESSION: 1. Large area of right lower lobe pneumonia and less significant left basilar infiltrate. Findings could be due to aspiration. 2. No leaking contrast material or free air to suggest surgical complication. No abscess. 3. Cholelithiasis. Electronically Signed   By: Rudie Meyer M.D.   On: 07/23/2021 06:55   ? ?Anti-infectives: ?Anti-infectives (From admission, onward)  ? ? Start     Dose/Rate Route Frequency Ordered Stop  ? 07/22/21 1100  cefTRIAXone (ROCEPHIN) 2 g in sodium chloride 0.9 % 100 mL IVPB       ? 2 g ?200 mL/hr over 30 Minutes Intravenous Daily 07/22/21 1007 07/22/21 1249  ? 07/18/21 0900  cefTRIAXone (ROCEPHIN) 2 g in sodium chloride 0.9 % 100 mL IVPB       ? 2 g ?200 mL/hr over 30 Minutes Intravenous Daily 07/18/21 0711 07/21/21 1006  ? 07/18/21 0900  metroNIDAZOLE (FLAGYL) IVPB 500 mg       ? 500 mg ?100 mL/hr over 60 Minutes Intravenous 2 times daily 07/18/21 0711 07/22/21 2351  ? 07/18/21 0115  piperacillin-tazobactam (ZOSYN) IVPB 3.375 g       ? 3.375 g ?100 mL/hr over 30 Minutes Intravenous  Once 07/18/21 0114 07/18/21 0216  ? ?  ? ? ? ?Assessment/Plan ?Perforated gastric ulcer of pyloric channel ?POD 5 S/P exploratory laparotomy, graham patch repair 07/18/21 Dr. Donell Beers ?- CT negative for leak or post op complication. Discussed w/ MD. Pull NGT and place on FLD.  ?- H pylori positive - will initiate quad therapy. Will discuss w/ pharmacy if this would cover for possible PNA seen on CT yesterday.  ?- D/c PCA. PO and IV PRN pain med orders placed.  ?- BID wet to dry dressing to midline wound ?- Mobilize ?- Pulm toilet.  ?HTN - No hx or home meds. On scheduled lopressor with PRN . Will likely need to be transitioned to PO before d/c w/ PCP f/u if remains elevated.  ?  ?FEN: FLD, IVF @ 100cc/hr ?VTE: SCDs, LMWH ?ID: rocephin/flagyl 4/26 - 4/30. Start quad therapy for H. Pylori. Febrile 4/29 at 101.7. Afebrile overnight. WBC pending this am.  ? ? LOS: 5 days  ? ? ?Jacinto Halim ,  PA-C ?[facility] ?07/23/2021, 10:02 AM ?Please see Amion for pager number during day hours 7:00am-4:30pm ? ?

## 2021-07-24 MED ORDER — METHOCARBAMOL 500 MG PO TABS: 1000.0000 mg | ORAL_TABLET | Freq: Four times a day (QID) | ORAL | 1 refills | Status: DC | PRN

## 2021-07-24 MED ORDER — METRONIDAZOLE 500 MG PO TABS: 500.0000 mg | ORAL_TABLET | Freq: Two times a day (BID) | ORAL | 0 refills | Status: AC

## 2021-07-24 MED ORDER — PANTOPRAZOLE SODIUM 40 MG PO TBEC: 40.0000 mg | DELAYED_RELEASE_TABLET | Freq: Two times a day (BID) | ORAL | 0 refills | Status: AC

## 2021-07-24 MED ORDER — OXYCODONE HCL 5 MG PO TABS: 5.0000 mg | ORAL_TABLET | ORAL | 0 refills | Status: DC | PRN

## 2021-07-24 MED ORDER — ACETAMINOPHEN 500 MG PO TABS: 1000.0000 mg | ORAL_TABLET | Freq: Four times a day (QID) | ORAL | Status: AC | PRN

## 2021-07-24 MED ORDER — CLARITHROMYCIN 500 MG PO TABS: 500.0000 mg | ORAL_TABLET | Freq: Two times a day (BID) | ORAL | 0 refills | Status: AC

## 2021-07-24 MED ORDER — AMOXICILLIN 500 MG PO CAPS: 1000.0000 mg | ORAL_CAPSULE | Freq: Two times a day (BID) | ORAL | 0 refills | Status: AC

## 2021-07-24 NOTE — Progress Notes (Signed)
Patient was given discharge instructions, and all questions were answered. Patient and family member was shown how to change dressing with no questions. Patient was stable for discharge and was taken to the main exit by wheelchair. ?

## 2021-07-24 NOTE — Progress Notes (Signed)
6 Days Post-Op  ? ?Subjective/Chief Complaint: ?Doing well ?Tol fulls ? ? ? ?Objective: ?Vital signs in last 24 hours: ?Temp:  [98.2 ?F (36.8 ?C)-99.4 ?F (37.4 ?C)] 99.2 ?F (37.3 ?C) (05/02 0551) ?Pulse Rate:  [71-82] 77 (05/02 0551) ?Resp:  [16-18] 16 (05/02 0551) ?BP: (140-165)/(91-100) 154/93 (05/02 0551) ?SpO2:  [95 %-98 %] 96 % (05/02 0551) ?Last BM Date : 07/23/21 ? ?Intake/Output from previous day: ?05/01 0701 - 05/02 0700 ?In: 3632.1 [P.O.:820; I.V.:2812.1] ?Out: 1200 [Urine:1200] ?Intake/Output this shift: ?No intake/output data recorded. ? ?General appearance: alert and cooperative ?GI: soft, non-tender; bowel sounds normal; no masses,  no organomegaly and inc c/d/i ? ?Lab Results:  ?Recent Labs  ?  07/22/21 ?[id number] 07/23/21 ?[id number]  ?WBC 10.7* 7.5  ?HGB 8.7* 8.7*  ?HCT 28.1* 29.1*  ?PLT 344 343  ? ?BMET ?Recent Labs  ?  07/22/21 ?[id number] 07/23/21 ?[id number]  ?NA 136 135  ?K 3.5 3.7  ?CL 106 103  ?CO2 24 25  ?GLUCOSE 99 101*  ?BUN 10 10  ?CREATININE 0.68 0.79  ?CALCIUM 8.5* 8.4*  ? ?PT/INR ?No results for input(s): LABPROT, INR in the last 72 hours. ?ABG ?No results for input(s): PHART, HCO3 in the last 72 hours. ? ?Invalid input(s): PCO2, PO2 ? ?Studies/Results: ?CT ABDOMEN PELVIS W WO CONTRAST ? ?Result Date: 07/23/2021 ?CLINICAL DATA:  Status post surgery for perforated ulcer. EXAM: CT ABDOMEN AND PELVIS WITHOUT AND WITH CONTRAST TECHNIQUE: Multidetector CT imaging of the abdomen and pelvis was performed following the standard protocol before and following the bolus administration of intravenous contrast. RADIATION DOSE REDUCTION: This exam was performed according to the departmental dose-optimization program which includes automated exposure control, adjustment of the mA and/or kV according to patient size and/or use of iterative reconstruction technique. CONTRAST:  17mL OMNIPAQUE IOHEXOL 300 MG/ML  SOLN COMPARISON:  CT scans 07/20/2021 FINDINGS: Lower chest: Dense airspace consolidation in the right lower lobe  with air bronchograms consistent with large area of pneumonia. There is also patchy airspace opacity and tree-in-bud nodularity in the left lower lobe suggesting early pneumonia. Findings could be due to aspiration. An NG tube is coursing down esophagus and into the stomach. Hepatobiliary: Stable simple hepatic cysts. No worrisome hepatic lesions or intrahepatic biliary dilatation. The gallbladder contains some layering gallstones. No common bile duct dilatation. Pancreas: No mass, inflammation or ductal dilatation. Spleen: Normal size. No focal lesions. Adrenals/Urinary Tract: Adrenal glands and kidneys are unremarkable and stable. Stomach/Bowel: There is an NG tube in the stomach. The duodenum, small colon are. No leaking contrast material or free air to suggest surgical complication. No abscess is identified. Vascular/Lymphatic: The aorta and branch vessels are patent. The major venous structures are patent. Reproductive: The prostate gland and seminal vesicles are. Other: No pelvic mass or adenopathy. No free pelvic fluid collections. No inguinal mass or adenopathy. No abdominal wall hernia or subcutaneous lesions. Musculoskeletal: No significant bony findings. IMPRESSION: 1. Large area of right lower lobe pneumonia and less significant left basilar infiltrate. Findings could be due to aspiration. 2. No leaking contrast material or free air to suggest surgical complication. No abscess. 3. Cholelithiasis. Electronically Signed   By: Marijo Sanes M.D.   On: 07/23/2021 06:55   ? ?Anti-infectives: ?Anti-infectives (From admission, onward)  ? ? Start     Dose/Rate Route Frequency Ordered Stop  ? 07/23/21 1200  clarithromycin (BIAXIN) tablet 500 mg       ? 500 mg Oral Every 12 hours 07/23/21 1040 08/06/21 0959  ?  07/23/21 1200  amoxicillin (AMOXIL) capsule 1,000 mg       ? 1,000 mg Oral Every 12 hours 07/23/21 1040 08/06/21 0959  ? 07/23/21 1200  metroNIDAZOLE (FLAGYL) tablet 500 mg       ? 500 mg Oral Every 12 hours  07/23/21 1040 08/06/21 0959  ? 07/22/21 1100  cefTRIAXone (ROCEPHIN) 2 g in sodium chloride 0.9 % 100 mL IVPB       ? 2 g ?200 mL/hr over 30 Minutes Intravenous Daily 07/22/21 1007 07/22/21 1249  ? 07/18/21 0900  cefTRIAXone (ROCEPHIN) 2 g in sodium chloride 0.9 % 100 mL IVPB       ? 2 g ?200 mL/hr over 30 Minutes Intravenous Daily 07/18/21 0711 07/21/21 1006  ? 07/18/21 0900  metroNIDAZOLE (FLAGYL) IVPB 500 mg       ? 500 mg ?100 mL/hr over 60 Minutes Intravenous 2 times daily 07/18/21 0711 07/22/21 2351  ? 07/18/21 0115  piperacillin-tazobactam (ZOSYN) IVPB 3.375 g       ? 3.375 g ?100 mL/hr over 30 Minutes Intravenous  Once 07/18/21 0114 07/18/21 0216  ? ?  ? ? ?Assessment/Plan: ?s/p Procedure(s): ?EXPLORATORY LAPAROTOMY WITH GRAHAM PATCH (N/A) ?Advance diet as tol ?Hopefully home today if doing well and tol PO ?Will need to be DC'd with abd for perf ulcer and pna.   ? ? LOS: 6 days  ? ? ?[name] ?07/24/2021 ? ?

## 2021-07-24 NOTE — Discharge Summary (Signed)
[facility] ?Discharge Summary  ? ?Patient ID: ?[name] ?MRN: [id number] ?DOB/AGE: February 17, 1981 41 y.o. ? ?Admit date: 07/17/2021 ?Discharge date: 07/24/2021 ? ?Admitting Diagnosis: ?Perforated peptic ulcer  ? ?Discharge Diagnosis ?Perforated pyloric channel ulcer ?S/P repair of perforated ulcer ?HTN ?H. Pylori infection  ?Possible PNA ? ?Consultants ?None  ? ?Imaging: ?CT ABDOMEN PELVIS W WO CONTRAST ? ?Result Date: 07/23/2021 ?CLINICAL DATA:  Status post surgery for perforated ulcer. EXAM: CT ABDOMEN AND PELVIS WITHOUT AND WITH CONTRAST TECHNIQUE: Multidetector CT imaging of the abdomen and pelvis was performed following the standard protocol before and following the bolus administration of intravenous contrast. RADIATION DOSE REDUCTION: This exam was performed according to the departmental dose-optimization program which includes automated exposure control, adjustment of the mA and/or kV according to patient size and/or use of iterative reconstruction technique. CONTRAST:  129mL OMNIPAQUE IOHEXOL 300 MG/ML  SOLN COMPARISON:  CT scans 07/20/2021 FINDINGS: Lower chest: Dense airspace consolidation in the right lower lobe with air bronchograms consistent with large area of pneumonia. There is also patchy airspace opacity and tree-in-bud nodularity in the left lower lobe suggesting early pneumonia. Findings could be due to aspiration. An NG tube is coursing down esophagus and into the stomach. Hepatobiliary: Stable simple hepatic cysts. No worrisome hepatic lesions or intrahepatic biliary dilatation. The gallbladder contains some layering gallstones. No common bile duct dilatation. Pancreas: No mass, inflammation or ductal dilatation. Spleen: Normal size. No focal lesions. Adrenals/Urinary Tract: Adrenal glands and kidneys are unremarkable and stable. Stomach/Bowel: There is an NG tube in the stomach. The duodenum, small colon are. No leaking contrast material or free air to suggest surgical complication. No  abscess is identified. Vascular/Lymphatic: The aorta and branch vessels are patent. The major venous structures are patent. Reproductive: The prostate gland and seminal vesicles are. Other: No pelvic mass or adenopathy. No free pelvic fluid collections. No inguinal mass or adenopathy. No abdominal wall hernia or subcutaneous lesions. Musculoskeletal: No significant bony findings. IMPRESSION: 1. Large area of right lower lobe pneumonia and less significant left basilar infiltrate. Findings could be due to aspiration. 2. No leaking contrast material or free air to suggest surgical complication. No abscess. 3. Cholelithiasis. Electronically Signed   By: Marijo Sanes M.D.   On: 07/23/2021 06:55   ? ?Procedures ?Dr. Stark Klein (07/18/21) - Exploratory laparotomy, graham patch repair of perforated gastric ulcer ? ?Hospital Course:  ?Patient is a 41 year old male who presented to the ED with abdominal pain.  Workup showed perforated viscus.  Patient was admitted and underwent procedure listed above.  Tolerated procedure well and was transferred to the floor. POD4 patient developed fever and CT AP with PO and IV contrast obtained. CT was negative for leak but did show possible PNA. POD5 NGT removed and patient started on CLD. Patient was also noted to be H. Pylori positive, started on quad therapy for this POD5  Diet was advanced as tolerated.  On POD6, the patient was voiding well, tolerating diet, ambulating well, pain well controlled, vital signs stable, wound clean and felt stable for discharge home.  Patient will follow up in our office in 3 weeks and knows to call with questions or concerns.  He will call to confirm appointment date/time.   ? ?He was incidentally hypertensive while admitted, this improved and patient was instructed to follow up with a PCP after discharge.  ? ?I or a member of my team have reviewed this patient in the Controlled Substance Database. ? ? ?Allergies  as of 07/24/2021   ?No Known  Allergies ?  ? ?  ?Medication List  ?  ? ?STOP taking these medications   ? ?aspirin-acetaminophen-caffeine 250-250-65 MG tablet ?Commonly known as: [place] ?  ?dicyclomine 20 MG tablet ?Commonly known as: BENTYL ?  ?ferrous sulfate 325 (65 FE) MG tablet ?  ?ibuprofen 200 MG tablet ?Commonly known as: ADVIL ?  ?Sore Throat Spray 1.4 % Liqd ?Generic drug: phenol ?  ? ?  ? ?TAKE these medications   ? ?acetaminophen 500 MG tablet ?Commonly known as: TYLENOL ?Take 2 tablets (1,000 mg total) by mouth every 6 (six) hours as needed for mild pain or fever. ?  ?amoxicillin 500 MG capsule ?Commonly known as: AMOXIL ?Take 2 capsules (1,000 mg total) by mouth every 12 (twelve) hours for 13 days. ?  ?clarithromycin 500 MG tablet ?Commonly known as: BIAXIN ?Take 1 tablet (500 mg total) by mouth every 12 (twelve) hours for 13 days. ?  ?methocarbamol 500 MG tablet ?Commonly known as: ROBAXIN ?Take 2 tablets (1,000 mg total) by mouth every 6 (six) hours as needed for muscle spasms. ?  ?metroNIDAZOLE 500 MG tablet ?Commonly known as: FLAGYL ?Take 1 tablet (500 mg total) by mouth every 12 (twelve) hours for 12 days. ?  ?oxyCODONE 5 MG immediate release tablet ?Commonly known as: Oxy IR/ROXICODONE ?Take 1 tablet (5 mg total) by mouth every 4 (four) hours as needed for severe pain or moderate pain. ?  ?pantoprazole 40 MG tablet ?Commonly known as: PROTONIX ?Take 1 tablet (40 mg total) by mouth 2 (two) times daily. ?  ? ?  ? ? ? ? Follow-up Information   ? ? [place] Follow up.   ?Why: If you do not want to follow up with your assigned physician through [facility], you can call this clinic for a hospital follow up appointment and to establish care. ?Contact information: ?[address] ?[place] [phone] ?[phone] ? ?  ?  ? ? Stark Klein, MD. Schedule an appointment as soon as possible for a visit on 08/13/2021.   ?Specialty: General Surgery ?Why: 10:45 AM.  Please arrive 30 min prior to appointment time for check in. Bring photo ID and insurance information with you. ?Contact information: ?[address] ?[address] ?[place] [address] ?[phone] ? ? ?  ?  ? ?  ?  ? ?  ? ? ?Signed: ?Norm Parcel , PA-C ?[facility] ?07/24/2021, 1:17 PM ?Please see Amion for pager number during day hours 7:00am-4:30pm ? ? ? ?

## 2021-07-25 ENCOUNTER — Telehealth: Payer: Self-pay

## 2021-07-25 NOTE — Telephone Encounter (Signed)
Transition Care Management Follow-up Telephone Call ?Date of discharge and from where: 07/24/2021 from [facility] ?How have you been since you were released from the hospital? Patient stated that he is feeling well today and did not have any questions or concerns at this time.  ?Any questions or concerns? No ? ?Items Reviewed: ?Did the pt receive and understand the discharge instructions provided? Yes  ?Medications obtained and verified? Yes  ?Other? No  ?Any new allergies since your discharge? No  ?Dietary orders reviewed? No ?Do you have support at home? Yes  ? ?Functional Questionnaire: (I = Independent and D = Dependent) ?ADLs: I ? ?Bathing/Dressing- I ? ?Meal Prep- I ? ?Eating- I ? ?Maintaining continence- I ? ?Transferring/Ambulation- I ? ?Managing Meds- I ? ? ?Follow up appointments reviewed: ? ?PCP Hospital f/u appt confirmed? No  Not ready to est at this time.  ?[facility] f/u appt confirmed? Yes  Scheduled to see [facility] on 08/13/2021 @ 10:45am. ?Are transportation arrangements needed? No  ?If their condition worsens, is the pt aware to call PCP or go to the Emergency Dept.? Yes ?Was the patient provided with contact information for the PCP's office or ED? Yes ?Was to pt encouraged to call back with questions or concerns? Yes ? ?

## 2021-08-15 ENCOUNTER — Encounter: Payer: Medicaid Other | Admitting: Student

## 2021-08-30 ENCOUNTER — Telehealth: Payer: Self-pay | Admitting: *Deleted

## 2021-08-30 ENCOUNTER — Encounter: Payer: Medicaid Other | Admitting: Internal Medicine

## 2021-08-30 NOTE — Telephone Encounter (Signed)
Called patient and left voice message for patient to call the clinic at [phone] if he wishes to reschedule his missed appointment.

## 2021-08-30 NOTE — Progress Notes (Deleted)
  CC: HFU/Establish Care  HPI:  [name] is a 41 y.o. male with a past medical history stated below and presents today for  HFU/Establish Care. Please see problem based assessment and plan for additional details.  Past Medical History:  Diagnosis Date   Mental disorder    Migraine    Ulcer     Current Outpatient Medications on File Prior to Visit  Medication Sig Dispense Refill   acetaminophen (TYLENOL) 500 MG tablet Take 2 tablets (1,000 mg total) by mouth every 6 (six) hours as needed for mild pain or fever.     methocarbamol (ROBAXIN) 500 MG tablet Take 2 tablets (1,000 mg total) by mouth every 6 (six) hours as needed for muscle spasms. 60 tablet 1   oxyCODONE (OXY IR/ROXICODONE) 5 MG immediate release tablet Take 1 tablet (5 mg total) by mouth every 4 (four) hours as needed for severe pain or moderate pain. 30 tablet 0   pantoprazole (PROTONIX) 40 MG tablet Take 1 tablet (40 mg total) by mouth 2 (two) times daily. 60 tablet 0   [DISCONTINUED] citalopram (CELEXA) 20 MG tablet Take 1 tablet (20 mg total) by mouth daily. For depression/anxiety (Patient not taking: Reported on 04/11/2016) 30 tablet 0   [DISCONTINUED] SUMAtriptan (IMITREX) 100 MG tablet Take 1 tablet (100 mg total) by mouth every 2 (two) hours as needed for migraine or headache. May repeat in 2 hours if headache persists or recurs. (Patient not taking: Reported on 04/11/2016) 10 tablet 0   No current facility-administered medications on file prior to visit.    No family history on file.  Social History: ***  Review of Systems: ROS negative except for what is noted on the assessment and plan.  There were no vitals filed for this visit.   Physical Exam: General: Well appearing ***, NAD HENT: normocephalic, atraumatic EYES: conjunctiva non-erythematous, no scleral icterus CV: regular rate, normal rhythm, no murmurs, rubs, gallops. Pulmonary: normal work of breathing on RA, lungs clear to auscultation, no  rales, wheezes, rhonchi Abdominal: non-distended, soft, non-tender to palpation, normal BS Skin: Warm and dry, no rashes or lesions Neurological: MS: awake, alert and oriented x3, normal speech and fund of knowledge Motor: moves all extremities antigravity Psych: normal affect    Assessment & Plan:   See Encounters Tab for problem based charting.  Patient {GC/GE:[id number]::"discussed with","seen with"} Dr. {JHERD:[id number]::"JEHUDJSH","F. Hoffman","Mullen","Narendra","Raines","Vincent","Guilloud"}  Gwenevere Abbot, M.D. [facility] Internal Medicine, PGY-1 Pager: [phone]

## 2021-09-13 ENCOUNTER — Other Ambulatory Visit: Payer: Self-pay | Admitting: General Surgery

## 2021-09-13 DIAGNOSIS — K275 Chronic or unspecified peptic ulcer, site unspecified, with perforation: Secondary | ICD-10-CM

## 2021-09-13 DIAGNOSIS — R1084 Generalized abdominal pain: Secondary | ICD-10-CM

## 2021-09-26 ENCOUNTER — Ambulatory Visit: Admission: RE | Admit: 2021-09-26 | Payer: Medicaid Other | Source: Ambulatory Visit

## 2022-08-14 ENCOUNTER — Emergency Department (HOSPITAL_COMMUNITY): Payer: 59

## 2022-08-14 ENCOUNTER — Other Ambulatory Visit: Payer: Self-pay

## 2022-08-14 ENCOUNTER — Emergency Department (HOSPITAL_COMMUNITY)
Admission: EM | Admit: 2022-08-14 | Discharge: 2022-08-14 | Disposition: A | Discharge status: Home / Self Care | Payer: 59 | Attending: Emergency Medicine | Admitting: Emergency Medicine

## 2022-08-14 ENCOUNTER — Encounter (HOSPITAL_COMMUNITY): Payer: Self-pay

## 2022-08-14 DIAGNOSIS — M25511 Pain in right shoulder: Secondary | ICD-10-CM | POA: Diagnosis not present

## 2022-08-14 MED ORDER — HYDROCODONE-ACETAMINOPHEN 5-325 MG PO TABS: 1.0000 | ORAL_TABLET | Freq: Four times a day (QID) | ORAL | 0 refills | Status: AC | PRN

## 2022-08-14 MED ORDER — HYDROCODONE-ACETAMINOPHEN 5-325 MG PO TABS
1.0000 | ORAL_TABLET | Freq: Once | ORAL | Status: AC
  Administered 2022-08-14: 1 via ORAL
  Filled 2022-08-14: qty 1
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
## 2022-08-14 NOTE — Discharge Instructions (Addendum)
Return to the ED with any new or worsening symptoms such as fevers Follow-up with orthopedics.  Please call the office that I have referred you to and make an appointment to be seen.  Please utilize the number on this form. You may take ibuprofen or Tylenol at home for pain in her shoulder.  If ibuprofen or Tylenol does not alleviate your shoulder pain, you may then take pain medication that I prescribed your pharmacy.  Please do not drive or operate heavy machinery under the influence of pain medication.  Please be aware that pain medication may cause constipation and if this occurs you may purchase MiraLAX. Please remain in shoulder sling until seen by orthopedics.  You may take your shoulder out of the sling occasionally throughout the day to practice range of motion exercises however please stay in it for the majority of the day You may also purchase Salonpas patches to place on your shoulder See the attached work note

## 2023-02-09 IMAGING — CT CT ABD-PELV W/ CM
2 of 4 series · 15 of 46 positions shown, 17 images · IV contrast (OMNIPAQUE 300)
Comparison: CT 05/27/2021

CLINICAL DATA: Severe abdominal pain

EXAM:
CT ABDOMEN AND PELVIS WITH CONTRAST
TECHNIQUE: Multidetector CT imaging of the abdomen and pelvis was performed
using the standard protocol following bolus administration of
intravenous contrast.

[Series 2: axial st · axial · 0.68mm/px · z∈[-602,-207]mm · 12 of 89 slices shown, 14 images]
[im 5/89  soft-tissue]
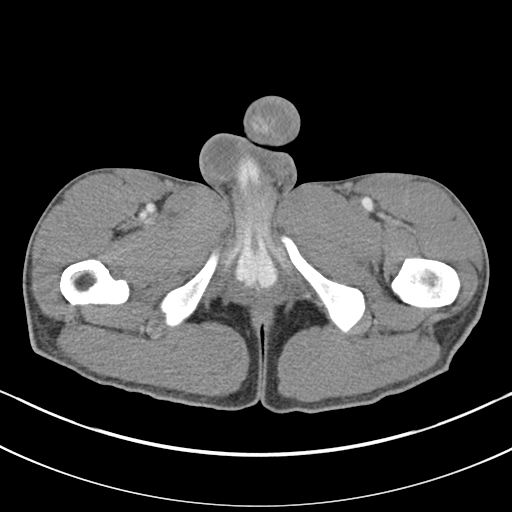
[im 5/89  bone]
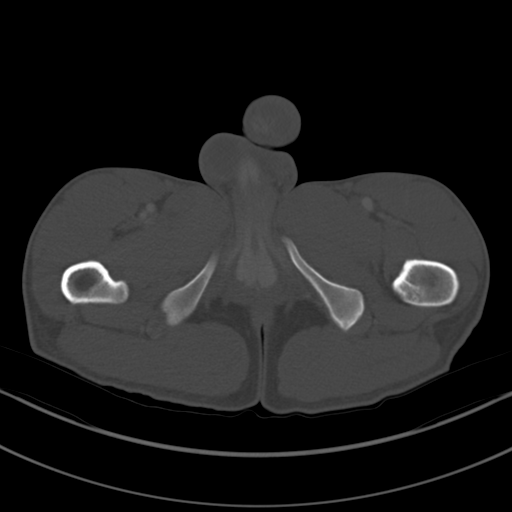
[im 14/89  soft-tissue]
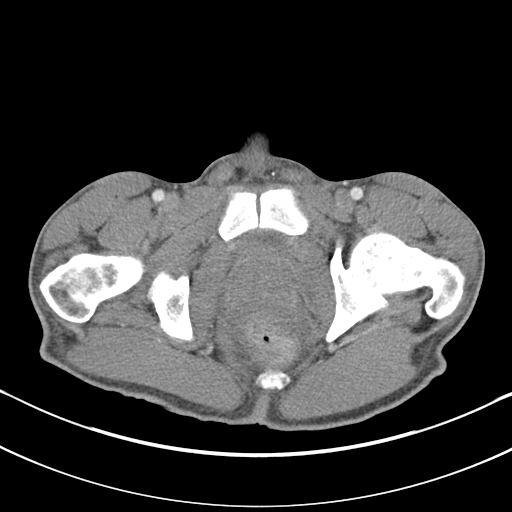
[im 19/89  soft-tissue]
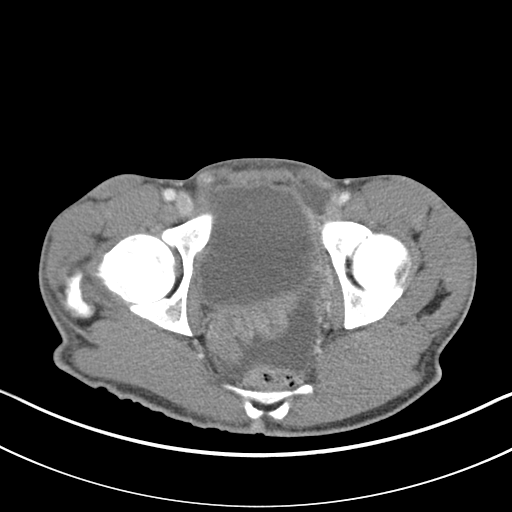
[im 28/89  soft-tissue]
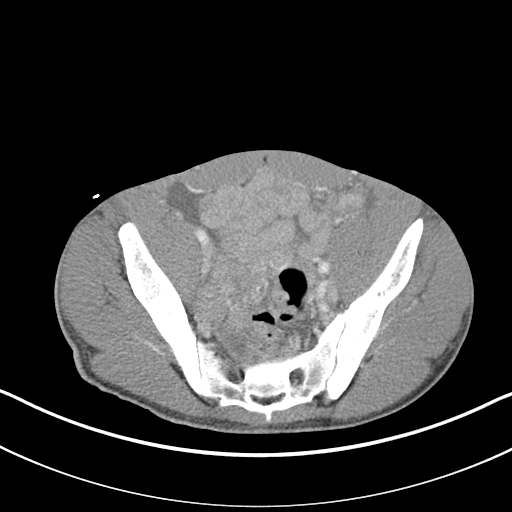
[im 33/89  soft-tissue]
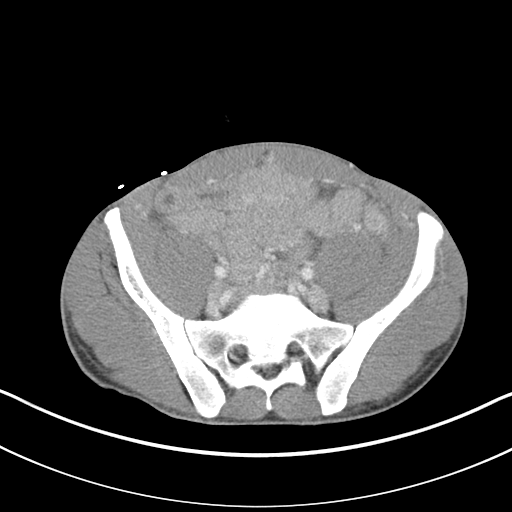
[im 42/89  soft-tissue]
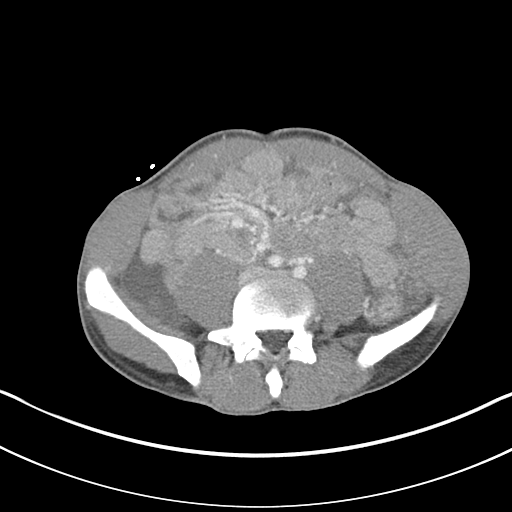
[im 47/89  soft-tissue]
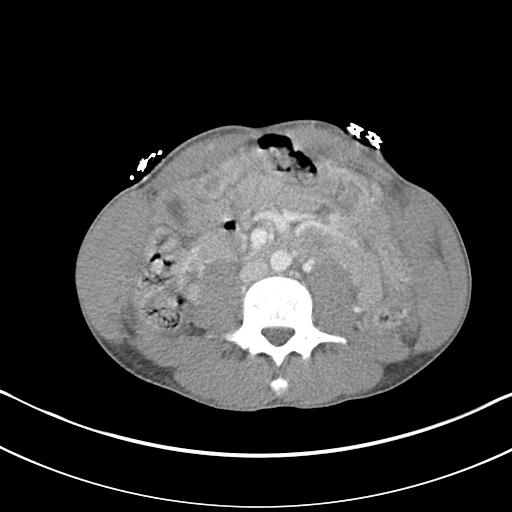
[im 56/89  soft-tissue]
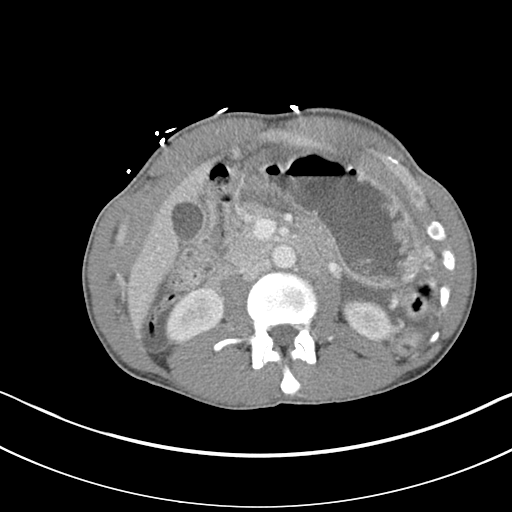
[im 61/89  soft-tissue]
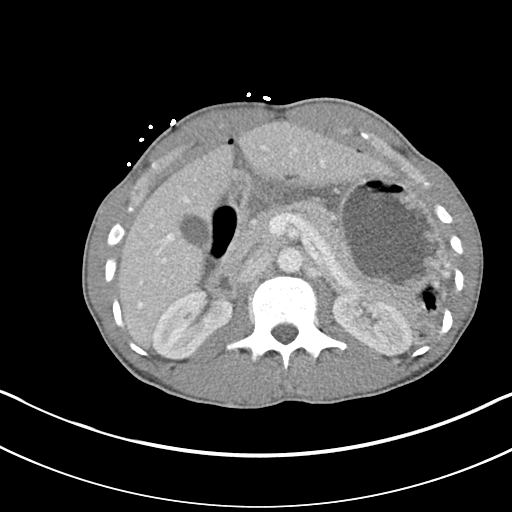
[im 61/89  bone]
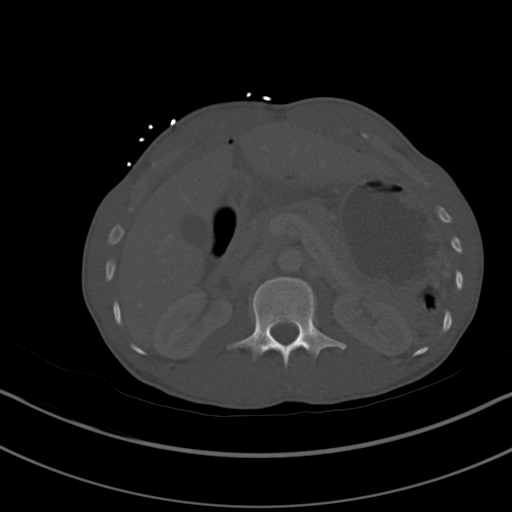
[im 70/89  soft-tissue]
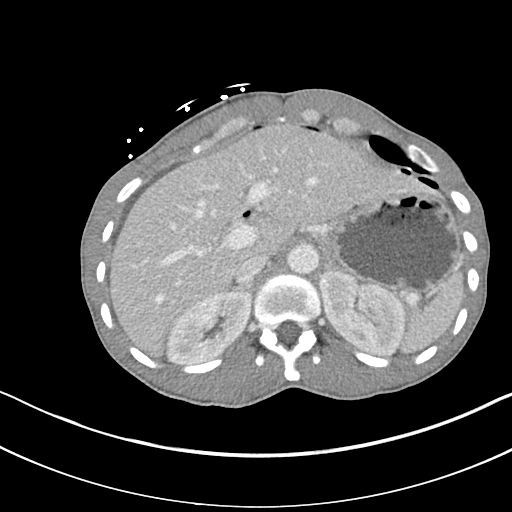
[im 75/89  soft-tissue]
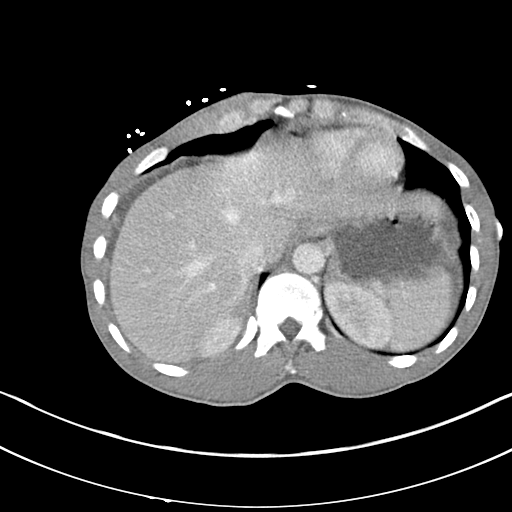
[im 84/89  soft-tissue]
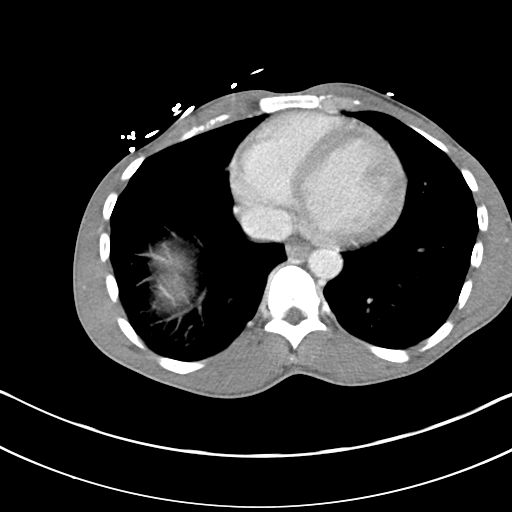

[Series 4: coronal st · coronal · 0.61mm/px · 3 of 120 slices shown]
[im 40/120  soft-tissue]
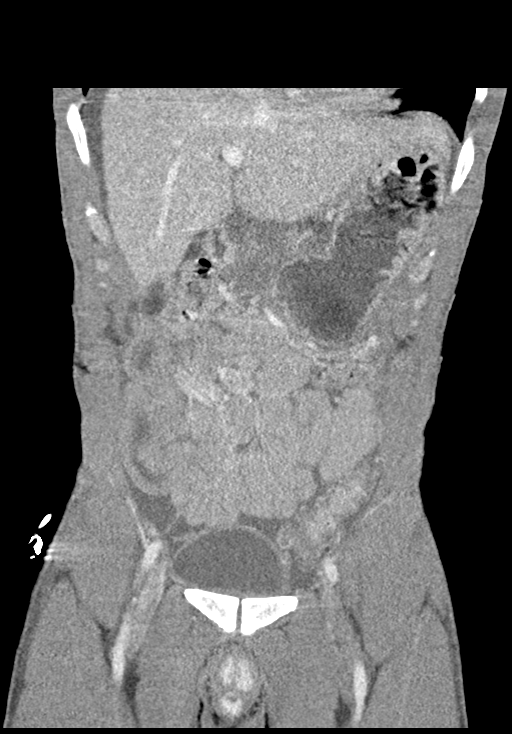
[im 53/120  soft-tissue]
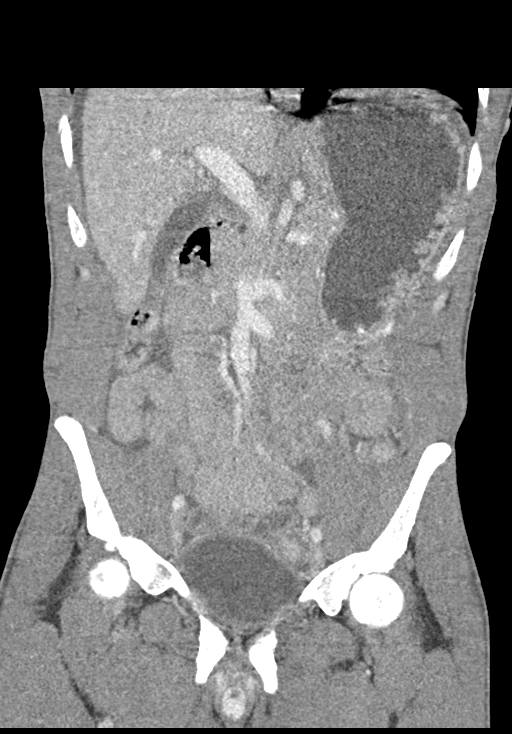
[im 67/120  soft-tissue]
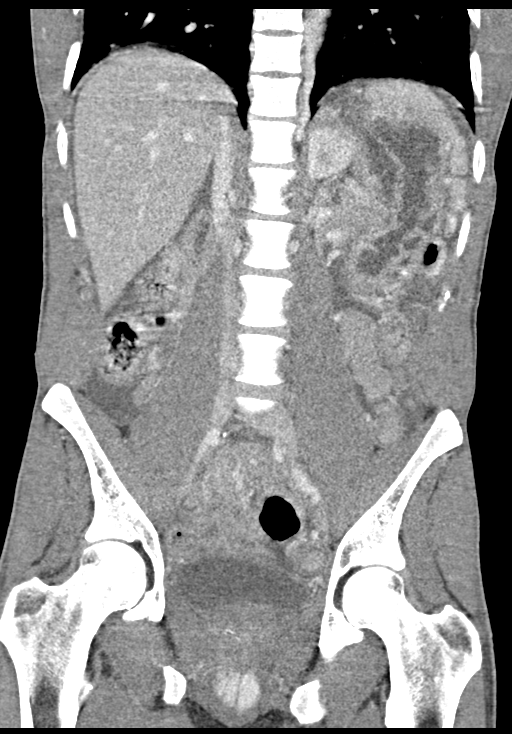

[15 of 46 positions shown; findings below may reference images not displayed]

RADIATION DOSE REDUCTION: This exam was performed according to the
departmental dose-optimization program which includes automated
exposure control, adjustment of the mA and/or kV according to
patient size and/or use of iterative reconstruction technique.

CONTRAST:  100mL OMNIPAQUE IOHEXOL 300 MG/ML  SOLN
FINDINGS: Lower chest: Lung bases demonstrate no acute consolidation or
effusion. Normal cardiac size

Hepatobiliary: Central hepatic cyst, no follow-up imaging is
recommended. No gallstones, gallbladder wall thickening, or biliary
dilatation.

Pancreas: Unremarkable. No pancreatic ductal dilatation or
surrounding inflammatory changes.

Spleen: Normal in size without focal abnormality.

Adrenals/Urinary Tract: Adrenal glands are normal. Kidneys show no
hydronephrosis. The bladder is unremarkable.

Stomach/Bowel: Moderate fluid distension of the stomach. Wall
thickening of the pyloric region of the stomach with possible
defect, coronal series 4, image 34. Nondilated small bowel. Slightly
thickened appearing lower abdominal small bowel loops possibly
reactive. Negative appendix

Vascular/Lymphatic: No significant vascular findings are present. No
enlarged abdominal or pelvic lymph nodes.

Reproductive: Prostate is unremarkable.

Other: Moderate volume free air in the upper abdomen. Moderate free
fluid within the abdomen and pelvis.

Musculoskeletal: No acute or significant osseous findings.
IMPRESSION: 1. Moderate volume free fluid and free air consistent with hollow
viscus perforation. Suspected source is suspected to be secondary to
perforated gastric ulcer as there is marked wall thickening of the
pyloric region of the stomach with potential defect visible on
coronal reconstructions.

Critical Value/emergent results were called by telephone at the time
of interpretation on 07/18/2021 at [DATE] to provider Shana Kimani
of the ED, who verbally acknowledged these results.

## 2023-02-10 IMAGING — DX DG ABD PORTABLE 1V
1 series · 1 of 1 positions shown · non-contrast
Comparison: May 21, 2010.

CLINICAL DATA: Nasogastric tube placement.

EXAM:
PORTABLE ABDOMEN - 1 VIEW

[abdomen kub]
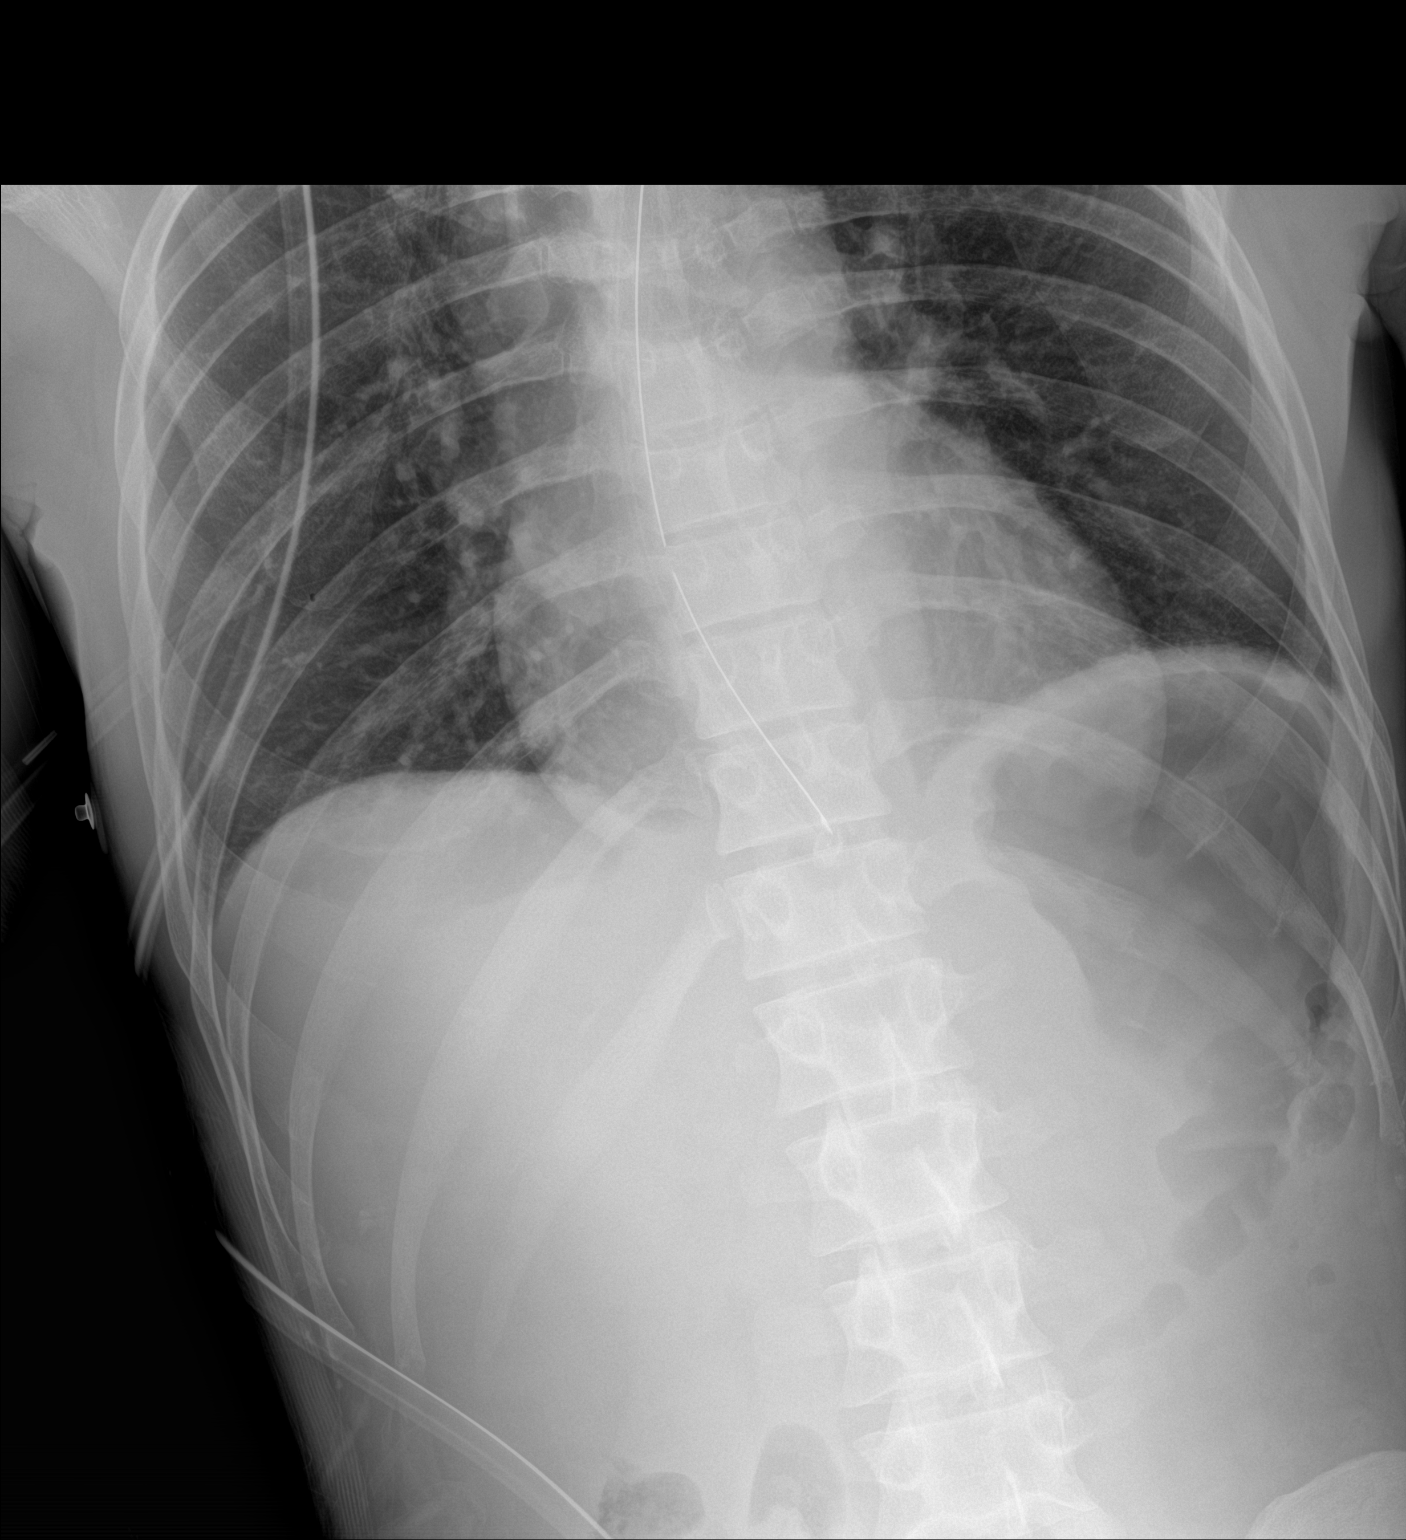

[1 of 1 positions shown; findings below may reference images not displayed]

FINDINGS: Distal tip of nasogastric tube is seen in expected position of
distal esophagus just above gastroesophageal junction. Advancement
is recommended. No abnormal bowel dilatation is noted
IMPRESSION: Distal tip of nasogastric tube is seen in expected position of
distal esophagus just above gastroesophageal junction; advancement
is recommended.

## 2023-05-15 ENCOUNTER — Encounter (HOSPITAL_COMMUNITY): Payer: Self-pay

## 2023-05-15 ENCOUNTER — Emergency Department (HOSPITAL_COMMUNITY)
Admission: EM | Admit: 2023-05-15 | Discharge: 2023-05-24 | Disposition: E | Payer: 59 | Attending: Emergency Medicine | Admitting: Emergency Medicine

## 2023-05-15 DIAGNOSIS — S41031A Puncture wound without foreign body of right shoulder, initial encounter: Secondary | ICD-10-CM | POA: Insufficient documentation

## 2023-05-15 DIAGNOSIS — I469 Cardiac arrest, cause unspecified: Secondary | ICD-10-CM | POA: Diagnosis not present

## 2023-05-15 DIAGNOSIS — S41132A Puncture wound without foreign body of left upper arm, initial encounter: Secondary | ICD-10-CM | POA: Diagnosis not present

## 2023-05-15 DIAGNOSIS — S0183XA Puncture wound without foreign body of other part of head, initial encounter: Secondary | ICD-10-CM | POA: Diagnosis present

## 2023-05-15 DIAGNOSIS — R404 Transient alteration of awareness: Secondary | ICD-10-CM | POA: Diagnosis not present

## 2023-05-15 DIAGNOSIS — I468 Cardiac arrest due to other underlying condition: Secondary | ICD-10-CM | POA: Diagnosis not present

## 2023-05-15 DIAGNOSIS — I499 Cardiac arrhythmia, unspecified: Secondary | ICD-10-CM | POA: Diagnosis not present

## 2023-05-15 DIAGNOSIS — S270XXA Traumatic pneumothorax, initial encounter: Secondary | ICD-10-CM | POA: Diagnosis not present

## 2023-05-15 DIAGNOSIS — S272XXA Traumatic hemopneumothorax, initial encounter: Secondary | ICD-10-CM | POA: Diagnosis not present

## 2023-05-15 LAB — PREPARE RBC (CROSSMATCH)

## 2023-05-15 MED ORDER — SODIUM BICARBONATE 8.4 % IV SOLN
INTRAVENOUS | Status: DC | PRN
  Administered 2023-05-15: 100 meq via INTRAVENOUS

## 2023-05-15 MED ORDER — SODIUM CHLORIDE 0.9% IV SOLUTION: Freq: Once | INTRAVENOUS | Status: DC

## 2023-05-15 MED ORDER — LACTATED RINGERS IV SOLN
INTRAVENOUS | Status: DC | PRN
  Administered 2023-05-15: 1000 mL via INTRAVENOUS

## 2023-05-15 MED ORDER — EPINEPHRINE 1 MG/10ML IJ SOSY
PREFILLED_SYRINGE | INTRAMUSCULAR | Status: DC | PRN
Start: 2023-05-15 — End: 2023-05-15
  Administered 2023-05-15 (×5): 1 mg via INTRAVENOUS

## 2023-05-16 ENCOUNTER — Encounter (HOSPITAL_COMMUNITY): Payer: Self-pay

## 2023-05-16 LAB — TYPE AND SCREEN
Unit division: 0
Unit division: 0

## 2023-05-16 LAB — BPAM RBC
Blood Product Expiration Date: 202503182359
Blood Product Expiration Date: 202503182359
ISSUE DATE / TIME: 202502201424
ISSUE DATE / TIME: 202502201425
Unit Type and Rh: 5100
Unit Type and Rh: 5100
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
## 2023-05-24 NOTE — ED Notes (Addendum)
Al Decant, the ME called by [facility] EDP.
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
## 2023-05-24 NOTE — ED Notes (Addendum)
Emergency thoracotomy Lt chest by Janee Morn, MD at this time.
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
## 2023-05-24 NOTE — ED Notes (Signed)
Patient time of death occurred at 19, called by Janee Morn, MD.

## 2023-05-24 NOTE — ED Notes (Signed)
During Emergency thoracotomy by Janee Morn, MD at 1421 he did manual massage compressions until end of code.

## 2023-05-24 NOTE — ED Notes (Signed)
First pulse check upon arrival at 1418, asystole.

## 2023-05-24 NOTE — ED Notes (Signed)
Charm Barges, MD inserting triple lumen Rt groin central line at this time.
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
## 2023-05-24 NOTE — ED Notes (Signed)
Anell Barr, RN pulling emergency blood at this time.
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
## 2023-05-24 DEATH — deceased
